# Patient Record
Sex: Male | Born: 1970 | Race: White | Hispanic: No | Marital: Married | Smoking: Current every day smoker
Health system: Southern US, Community
[De-identification: ages and names within clinical notes are randomized; demographics above are authoritative.]

## PROBLEM LIST (undated history)

## (undated) DIAGNOSIS — M1990 Unspecified osteoarthritis, unspecified site: Secondary | ICD-10-CM

## (undated) DIAGNOSIS — R51 Headache: Secondary | ICD-10-CM

## (undated) HISTORY — PX: KNEE SURGERY: SHX244

---

## 2005-02-10 ENCOUNTER — Encounter: Admission: RE | Admit: 2005-02-10 | Discharge: 2005-02-18 | Payer: Self-pay | Admitting: Family Medicine

## 2006-06-15 ENCOUNTER — Ambulatory Visit: Payer: Self-pay | Admitting: Hospitalist

## 2008-08-31 ENCOUNTER — Encounter: Payer: Self-pay | Admitting: Internal Medicine

## 2008-08-31 ENCOUNTER — Ambulatory Visit: Payer: Self-pay | Admitting: Internal Medicine

## 2008-08-31 DIAGNOSIS — M5126 Other intervertebral disc displacement, lumbar region: Secondary | ICD-10-CM

## 2008-10-04 ENCOUNTER — Ambulatory Visit: Payer: Self-pay | Admitting: Internal Medicine

## 2008-11-05 ENCOUNTER — Encounter: Payer: Self-pay | Admitting: Licensed Clinical Social Worker

## 2008-11-05 ENCOUNTER — Ambulatory Visit: Payer: Self-pay | Admitting: Internal Medicine

## 2008-11-08 ENCOUNTER — Encounter: Payer: Self-pay | Admitting: Licensed Clinical Social Worker

## 2008-11-17 ENCOUNTER — Encounter (INDEPENDENT_AMBULATORY_CARE_PROVIDER_SITE_OTHER): Payer: Self-pay | Admitting: *Deleted

## 2008-11-17 ENCOUNTER — Encounter (INDEPENDENT_AMBULATORY_CARE_PROVIDER_SITE_OTHER): Payer: Self-pay | Admitting: Internal Medicine

## 2008-11-18 ENCOUNTER — Encounter: Payer: Self-pay | Admitting: Internal Medicine

## 2008-11-19 ENCOUNTER — Telehealth: Payer: Self-pay | Admitting: *Deleted

## 2008-11-27 ENCOUNTER — Telehealth (INDEPENDENT_AMBULATORY_CARE_PROVIDER_SITE_OTHER): Payer: Self-pay | Admitting: *Deleted

## 2008-12-20 ENCOUNTER — Ambulatory Visit: Payer: Self-pay | Admitting: Internal Medicine

## 2008-12-20 ENCOUNTER — Encounter: Payer: Self-pay | Admitting: Internal Medicine

## 2008-12-27 ENCOUNTER — Telehealth: Payer: Self-pay | Admitting: *Deleted

## 2009-01-11 ENCOUNTER — Telehealth: Payer: Self-pay | Admitting: *Deleted

## 2009-01-16 ENCOUNTER — Encounter: Payer: Self-pay | Admitting: Internal Medicine

## 2009-03-04 ENCOUNTER — Encounter: Payer: Self-pay | Admitting: Internal Medicine

## 2010-12-28 ENCOUNTER — Encounter: Payer: Self-pay | Admitting: *Deleted

## 2012-01-19 DIAGNOSIS — G8929 Other chronic pain: Secondary | ICD-10-CM | POA: Diagnosis not present

## 2012-01-19 DIAGNOSIS — M545 Low back pain: Secondary | ICD-10-CM | POA: Diagnosis not present

## 2012-01-19 DIAGNOSIS — M5137 Other intervertebral disc degeneration, lumbosacral region: Secondary | ICD-10-CM | POA: Diagnosis not present

## 2012-02-16 DIAGNOSIS — M5137 Other intervertebral disc degeneration, lumbosacral region: Secondary | ICD-10-CM | POA: Diagnosis not present

## 2012-02-16 DIAGNOSIS — M545 Low back pain: Secondary | ICD-10-CM | POA: Diagnosis not present

## 2012-06-14 DIAGNOSIS — M545 Low back pain: Secondary | ICD-10-CM | POA: Diagnosis not present

## 2012-06-14 DIAGNOSIS — G8929 Other chronic pain: Secondary | ICD-10-CM | POA: Diagnosis not present

## 2012-08-23 DIAGNOSIS — M545 Low back pain: Secondary | ICD-10-CM | POA: Diagnosis not present

## 2012-08-23 DIAGNOSIS — G8929 Other chronic pain: Secondary | ICD-10-CM | POA: Diagnosis not present

## 2013-02-20 DIAGNOSIS — M545 Low back pain: Secondary | ICD-10-CM | POA: Diagnosis not present

## 2013-02-20 DIAGNOSIS — G8929 Other chronic pain: Secondary | ICD-10-CM | POA: Diagnosis not present

## 2013-02-20 DIAGNOSIS — K117 Disturbances of salivary secretion: Secondary | ICD-10-CM | POA: Diagnosis not present

## 2013-08-31 DIAGNOSIS — Z136 Encounter for screening for cardiovascular disorders: Secondary | ICD-10-CM | POA: Diagnosis not present

## 2013-08-31 DIAGNOSIS — D72829 Elevated white blood cell count, unspecified: Secondary | ICD-10-CM | POA: Diagnosis not present

## 2013-08-31 DIAGNOSIS — M545 Low back pain: Secondary | ICD-10-CM | POA: Diagnosis not present

## 2013-08-31 DIAGNOSIS — Z131 Encounter for screening for diabetes mellitus: Secondary | ICD-10-CM | POA: Diagnosis not present

## 2013-08-31 DIAGNOSIS — Z23 Encounter for immunization: Secondary | ICD-10-CM | POA: Diagnosis not present

## 2013-08-31 DIAGNOSIS — Z0000 Encounter for general adult medical examination without abnormal findings: Secondary | ICD-10-CM | POA: Diagnosis not present

## 2013-08-31 DIAGNOSIS — M5137 Other intervertebral disc degeneration, lumbosacral region: Secondary | ICD-10-CM | POA: Diagnosis not present

## 2013-08-31 DIAGNOSIS — Z791 Long term (current) use of non-steroidal anti-inflammatories (NSAID): Secondary | ICD-10-CM | POA: Diagnosis not present

## 2013-09-12 DIAGNOSIS — M549 Dorsalgia, unspecified: Secondary | ICD-10-CM | POA: Diagnosis not present

## 2013-09-26 ENCOUNTER — Other Ambulatory Visit: Payer: Self-pay | Admitting: Neurological Surgery

## 2013-09-26 DIAGNOSIS — M549 Dorsalgia, unspecified: Secondary | ICD-10-CM | POA: Diagnosis not present

## 2013-09-26 DIAGNOSIS — M47816 Spondylosis without myelopathy or radiculopathy, lumbar region: Secondary | ICD-10-CM

## 2013-09-26 DIAGNOSIS — M47817 Spondylosis without myelopathy or radiculopathy, lumbosacral region: Secondary | ICD-10-CM | POA: Diagnosis not present

## 2013-09-30 ENCOUNTER — Ambulatory Visit
Admission: RE | Admit: 2013-09-30 | Discharge: 2013-09-30 | Disposition: A | Discharge status: Home / Self Care | Payer: Medicare Other | Source: Ambulatory Visit | Attending: Neurological Surgery | Admitting: Neurological Surgery

## 2013-09-30 DIAGNOSIS — M5126 Other intervertebral disc displacement, lumbar region: Secondary | ICD-10-CM | POA: Diagnosis not present

## 2013-09-30 DIAGNOSIS — M47816 Spondylosis without myelopathy or radiculopathy, lumbar region: Secondary | ICD-10-CM

## 2013-10-10 DIAGNOSIS — Z13220 Encounter for screening for lipoid disorders: Secondary | ICD-10-CM | POA: Diagnosis not present

## 2013-10-10 DIAGNOSIS — Z0000 Encounter for general adult medical examination without abnormal findings: Secondary | ICD-10-CM | POA: Diagnosis not present

## 2013-10-10 DIAGNOSIS — Z131 Encounter for screening for diabetes mellitus: Secondary | ICD-10-CM | POA: Diagnosis not present

## 2013-10-10 DIAGNOSIS — Z791 Long term (current) use of non-steroidal anti-inflammatories (NSAID): Secondary | ICD-10-CM | POA: Diagnosis not present

## 2013-10-10 DIAGNOSIS — Z23 Encounter for immunization: Secondary | ICD-10-CM | POA: Diagnosis not present

## 2013-10-10 DIAGNOSIS — D72829 Elevated white blood cell count, unspecified: Secondary | ICD-10-CM | POA: Diagnosis not present

## 2013-10-10 DIAGNOSIS — M545 Low back pain: Secondary | ICD-10-CM | POA: Diagnosis not present

## 2013-10-10 DIAGNOSIS — M5137 Other intervertebral disc degeneration, lumbosacral region: Secondary | ICD-10-CM | POA: Diagnosis not present

## 2013-10-17 DIAGNOSIS — D72829 Elevated white blood cell count, unspecified: Secondary | ICD-10-CM | POA: Diagnosis not present

## 2013-10-17 DIAGNOSIS — Z13220 Encounter for screening for lipoid disorders: Secondary | ICD-10-CM | POA: Diagnosis not present

## 2013-10-17 DIAGNOSIS — Z131 Encounter for screening for diabetes mellitus: Secondary | ICD-10-CM | POA: Diagnosis not present

## 2013-10-17 DIAGNOSIS — Z0000 Encounter for general adult medical examination without abnormal findings: Secondary | ICD-10-CM | POA: Diagnosis not present

## 2013-10-17 DIAGNOSIS — M5137 Other intervertebral disc degeneration, lumbosacral region: Secondary | ICD-10-CM | POA: Diagnosis not present

## 2013-10-17 DIAGNOSIS — M549 Dorsalgia, unspecified: Secondary | ICD-10-CM | POA: Diagnosis not present

## 2013-10-17 DIAGNOSIS — M545 Low back pain: Secondary | ICD-10-CM | POA: Diagnosis not present

## 2013-10-17 DIAGNOSIS — Z791 Long term (current) use of non-steroidal anti-inflammatories (NSAID): Secondary | ICD-10-CM | POA: Diagnosis not present

## 2013-12-04 DIAGNOSIS — IMO0002 Reserved for concepts with insufficient information to code with codable children: Secondary | ICD-10-CM | POA: Diagnosis not present

## 2014-02-09 ENCOUNTER — Other Ambulatory Visit: Payer: Self-pay | Admitting: Neurological Surgery

## 2014-02-09 DIAGNOSIS — M549 Dorsalgia, unspecified: Secondary | ICD-10-CM | POA: Diagnosis not present

## 2014-02-09 DIAGNOSIS — E663 Overweight: Secondary | ICD-10-CM | POA: Diagnosis not present

## 2014-03-02 ENCOUNTER — Encounter (HOSPITAL_COMMUNITY): Payer: Self-pay | Admitting: Pharmacy Technician

## 2014-03-05 ENCOUNTER — Ambulatory Visit (HOSPITAL_COMMUNITY)
Admission: RE | Admit: 2014-03-05 | Discharge: 2014-03-05 | Disposition: A | Discharge status: Home / Self Care | Payer: Medicare Other | Source: Ambulatory Visit | Attending: Neurological Surgery | Admitting: Neurological Surgery

## 2014-03-05 ENCOUNTER — Encounter (HOSPITAL_COMMUNITY): Payer: Self-pay

## 2014-03-05 ENCOUNTER — Encounter (HOSPITAL_COMMUNITY)
Admission: RE | Admit: 2014-03-05 | Discharge: 2014-03-05 | Disposition: A | Discharge status: Home / Self Care | Payer: Medicare Other | Source: Ambulatory Visit | Attending: Neurological Surgery | Admitting: Neurological Surgery

## 2014-03-05 DIAGNOSIS — Z01810 Encounter for preprocedural cardiovascular examination: Secondary | ICD-10-CM | POA: Insufficient documentation

## 2014-03-05 DIAGNOSIS — Z01818 Encounter for other preprocedural examination: Secondary | ICD-10-CM | POA: Diagnosis not present

## 2014-03-05 DIAGNOSIS — Z01812 Encounter for preprocedural laboratory examination: Secondary | ICD-10-CM | POA: Insufficient documentation

## 2014-03-05 HISTORY — DX: Unspecified osteoarthritis, unspecified site: M19.90

## 2014-03-05 HISTORY — DX: Headache: R51

## 2014-03-05 LAB — BASIC METABOLIC PANEL
BUN: 14 mg/dL (ref 6–23)
CALCIUM: 9.2 mg/dL (ref 8.4–10.5)
CO2: 27 mEq/L (ref 19–32)
CREATININE: 1.07 mg/dL (ref 0.50–1.35)
Chloride: 104 mEq/L (ref 96–112)
GFR calc Af Amer: >90 mL/min (ref >90)
GFR calc non Af Amer: 84 mL/min — ABNORMAL LOW (ref >90)
Glucose, Bld: 105 mg/dL — ABNORMAL HIGH (ref 70–99)
Potassium: 4.5 mEq/L (ref 3.7–5.3)
Sodium: 143 mEq/L (ref 137–147)

## 2014-03-05 LAB — CBC WITH DIFFERENTIAL/PLATELET
BASOS ABS: 0.1 10*3/uL (ref 0.0–0.1)
Basophils Relative: 0 % (ref 0–1)
EOS ABS: 0.2 10*3/uL (ref 0.0–0.7)
EOS PCT: 1 % (ref 0–5)
HCT: 44.5 % (ref 39.0–52.0)
Hemoglobin: 15.5 g/dL (ref 13.0–17.0)
LYMPHS PCT: 34 % (ref 12–46)
Lymphs Abs: 5.3 10*3/uL — ABNORMAL HIGH (ref 0.7–4.0)
MCH: 32.1 pg (ref 26.0–34.0)
MCHC: 34.8 g/dL (ref 30.0–36.0)
MCV: 92.1 fL (ref 78.0–100.0)
Monocytes Absolute: 1.3 10*3/uL — ABNORMAL HIGH (ref 0.1–1.0)
Monocytes Relative: 9 % (ref 3–12)
NEUTROS PCT: 56 % (ref 43–77)
Neutro Abs: 8.5 10*3/uL — ABNORMAL HIGH (ref 1.7–7.7)
PLATELETS: 198 10*3/uL (ref 150–400)
RBC: 4.83 MIL/uL (ref 4.22–5.81)
RDW: 13.9 % (ref 11.5–15.5)
WBC: 15.3 10*3/uL — ABNORMAL HIGH (ref 4.0–10.5)

## 2014-03-05 LAB — TYPE AND SCREEN
ABO/RH(D): AB POS
ANTIBODY SCREEN: NEGATIVE

## 2014-03-05 LAB — PROTIME-INR
INR: 0.97 (ref 0.00–1.49)
PROTHROMBIN TIME: 12.7 s (ref 11.6–15.2)

## 2014-03-05 LAB — SURGICAL PCR SCREEN
MRSA, PCR: NEGATIVE
STAPHYLOCOCCUS AUREUS: NEGATIVE

## 2014-03-05 LAB — ABO/RH: ABO/RH(D): AB POS

## 2014-03-05 NOTE — Pre-Procedure Instructions (Signed)
Adonus Uselman  03/05/2014   Your procedure is scheduled on:  Thursday, April 9.  Report to [facility], Main Entrance/ntrance "A"at 5:30 AM.  Call this number if you have problems the morning of surgery: [phone]   Remember:   Do not eat food or drink liquids after midnight Wednesday.   Take these medicines the morning of surgery with A SIP OF WATER: -Pain med if needed for pain. Stop Meloxicam April 4 thprior to surgery.   Do not wear jewelry, make-up or nail polish.  Do not wear lotions, powders, or perfumes.   Do not shave 48 hours prior to surgery.  Do not bring valuables to the hospital.  [facility] is not responsible for any belongings or valuables.               Contacts, dentures or bridgework may not be worn into surgery.  Leave suitcase in the car. After surgery it may be brought to your room.  For patients admitted to the hospital, discharge time is determined by yourtreatment team.                Special Instructions:-[facility] - Preparing for Surgery  Before surgery, you can play an important role.  Because skin is not sterile, your skin needs to be as free of germs as possible.  You can reduce the number of germs on you skin by washing with CHG (chlorahexidine gluconate) soap before surgery.  CHG is an antiseptic cleaner which kills germs and bonds with the skin to continue killing germs even after washing.  Please DO NOT use if you have an allergy to CHG or antibacterial soaps.  If your skin becomes reddened/irritated stop using the CHG and inform your nurse when you arrive at Short Stay.  Do not shave (including legs and underarms) for at least 48 hours prior to the first CHG shower.  You may shave your face.  Please follow these instructions carefully:   1.  Shower with CHG Soap the night before surgery and the                                morning of Surgery.  2.  If you choose to wash your hair, wash your hair first as usual with your        normal shampoo.  3.  After you shampoo, rinse your hair and body thoroughly to remove the                      Shampoo.  4.  Use CHG as you would any other liquid soap.  You can apply chg directly       to the skin and wash gently with scrungie or a clean washcloth.  5.  Apply the CHG Soap to your body ONLY FROM THE NECK DOWN.        Do not use on open wounds or open sores.  Avoid contact with your eyes,       ears, mouth and genitals (private parts).  Wash genitals (private parts)       with your normal soap.  6.  Wash thoroughly, paying special attention to the area where your surgery        will be performed.  7.  Thoroughly rinse your body with warm water from the neck down.  8.  DO NOT shower/wash with your normal soap after using and  rinsing off       the CHG Soap.  9.  Pat yourself dry with a clean towel.            10.  Wear clean pajamas.            11.  Place clean sheets on your bed the night of your first shower and do not        sleep with pets.  Day of Surgery  Do not apply any lotions/deoderants the morning of surgery.  Please wear clean clothes to the hospital/surgery center.      Please read over the following fact sheets that you were given: Pain Booklet, Coughing and Deep Breathing, Blood Transfusion Information and Surgical Site Infection Prevention

## 2014-03-06 NOTE — Progress Notes (Signed)
Anesthesia Chart Review: patient is a 43 year old male scheduled for L4-5, L5-S1 MAS, PLIF on 03/15/14 by Dr. Yetta BarreJones. History includes smoking, headaches, arthritis.  Preoperative labs, EKG, CXR noted. WBC 15.3.  He was afebrile at PAT. Currently, no steroid listed on medication list. Will defer decision for any repeat labs to Dr. Yetta BarreJones. If no acute changes then I anticipate that he can proceed as planned from an anesthesia standpoint.  Velna Ochsllison Diem Dicocco, PA-C [facility]/Anesthesiology Phone [phone] 03/06/2014 2:20 PM

## 2014-03-14 MED ORDER — DEXAMETHASONE SODIUM PHOSPHATE 10 MG/ML IJ SOLN
10.0000 mg | INTRAMUSCULAR | Status: AC
  Administered 2014-03-15: 10 mg via INTRAVENOUS
  Filled 2014-03-14: qty 1

## 2014-03-14 MED ORDER — CEFAZOLIN SODIUM-DEXTROSE 2-3 GM-% IV SOLR
2.0000 g | INTRAVENOUS | Status: AC
  Administered 2014-03-15: 2 g via INTRAVENOUS
  Filled 2014-03-14: qty 50

## 2014-03-15 ENCOUNTER — Encounter (HOSPITAL_COMMUNITY): Payer: Medicare Other | Admitting: Vascular Surgery

## 2014-03-15 ENCOUNTER — Inpatient Hospital Stay (HOSPITAL_COMMUNITY): Payer: Medicare Other

## 2014-03-15 ENCOUNTER — Inpatient Hospital Stay (HOSPITAL_COMMUNITY)
Admission: RE | Admit: 2014-03-15 | Discharge: 2014-03-17 | DRG: 460 | Disposition: A | Discharge status: Home / Self Care | Payer: Medicare Other | Source: Ambulatory Visit | Attending: Neurological Surgery | Admitting: Neurological Surgery

## 2014-03-15 ENCOUNTER — Encounter (HOSPITAL_COMMUNITY)
Admission: RE | Disposition: A | Discharge status: Home / Self Care | Payer: Medicare Other | Source: Ambulatory Visit | Attending: Neurological Surgery

## 2014-03-15 ENCOUNTER — Encounter (HOSPITAL_COMMUNITY): Payer: Self-pay | Admitting: *Deleted

## 2014-03-15 ENCOUNTER — Inpatient Hospital Stay (HOSPITAL_COMMUNITY): Payer: Medicare Other | Admitting: Anesthesiology

## 2014-03-15 DIAGNOSIS — K219 Gastro-esophageal reflux disease without esophagitis: Secondary | ICD-10-CM | POA: Diagnosis present

## 2014-03-15 DIAGNOSIS — M519 Unspecified thoracic, thoracolumbar and lumbosacral intervertebral disc disorder: Secondary | ICD-10-CM | POA: Diagnosis not present

## 2014-03-15 DIAGNOSIS — M48061 Spinal stenosis, lumbar region without neurogenic claudication: Secondary | ICD-10-CM | POA: Diagnosis not present

## 2014-03-15 DIAGNOSIS — F17200 Nicotine dependence, unspecified, uncomplicated: Secondary | ICD-10-CM | POA: Diagnosis present

## 2014-03-15 DIAGNOSIS — M47817 Spondylosis without myelopathy or radiculopathy, lumbosacral region: Secondary | ICD-10-CM | POA: Diagnosis present

## 2014-03-15 DIAGNOSIS — M5137 Other intervertebral disc degeneration, lumbosacral region: Secondary | ICD-10-CM | POA: Diagnosis present

## 2014-03-15 DIAGNOSIS — M5126 Other intervertebral disc displacement, lumbar region: Principal | ICD-10-CM | POA: Diagnosis present

## 2014-03-15 DIAGNOSIS — Z981 Arthrodesis status: Secondary | ICD-10-CM

## 2014-03-15 DIAGNOSIS — M51379 Other intervertebral disc degeneration, lumbosacral region without mention of lumbar back pain or lower extremity pain: Secondary | ICD-10-CM | POA: Diagnosis present

## 2014-03-15 HISTORY — PX: MAXIMUM ACCESS (MAS)POSTERIOR LUMBAR INTERBODY FUSION (PLIF) 2 LEVEL: SHX6369

## 2014-03-15 SURGERY — FOR MAXIMUM ACCESS (MAS) POSTERIOR LUMBAR INTERBODY FUSION (PLIF) 2 LEVEL
Anesthesia: General | Site: Back

## 2014-03-15 MED ORDER — HYDROMORPHONE HCL PF 1 MG/ML IJ SOLN
INTRAMUSCULAR | Status: AC
  Filled 2014-03-15: qty 1

## 2014-03-15 MED ORDER — HYDROMORPHONE HCL PF 1 MG/ML IJ SOLN
0.2500 mg | INTRAMUSCULAR | Status: DC | PRN
  Administered 2014-03-15 (×4): 0.5 mg via INTRAVENOUS

## 2014-03-15 MED ORDER — SODIUM CHLORIDE 0.9 % IJ SOLN
3.0000 mL | Freq: Two times a day (BID) | INTRAMUSCULAR | Status: DC
  Administered 2014-03-15 – 2014-03-16 (×3): 3 mL via INTRAVENOUS

## 2014-03-15 MED ORDER — DIAZEPAM 5 MG/ML IJ SOLN
2.5000 mg | Freq: Once | INTRAMUSCULAR | Status: AC
  Administered 2014-03-15: 2.5 mg via INTRAVENOUS

## 2014-03-15 MED ORDER — PROMETHAZINE HCL 25 MG/ML IJ SOLN: 6.2500 mg | INTRAMUSCULAR | Status: DC | PRN

## 2014-03-15 MED ORDER — DEXAMETHASONE 4 MG PO TABS
4.0000 mg | ORAL_TABLET | Freq: Four times a day (QID) | ORAL | Status: DC
  Administered 2014-03-15 – 2014-03-16 (×7): 4 mg via ORAL
  Filled 2014-03-15 (×12): qty 1

## 2014-03-15 MED ORDER — FENTANYL CITRATE 0.05 MG/ML IJ SOLN
INTRAMUSCULAR | Status: DC | PRN
  Administered 2014-03-15: 100 ug via INTRAVENOUS
  Administered 2014-03-15 (×4): 50 ug via INTRAVENOUS
  Administered 2014-03-15: 100 ug via INTRAVENOUS
  Administered 2014-03-15 (×5): 50 ug via INTRAVENOUS

## 2014-03-15 MED ORDER — OXYCODONE HCL 5 MG PO TABS
5.0000 mg | ORAL_TABLET | Freq: Once | ORAL | Status: AC | PRN
  Administered 2014-03-15: 5 mg via ORAL

## 2014-03-15 MED ORDER — OXYCODONE-ACETAMINOPHEN 5-325 MG PO TABS
1.0000 | ORAL_TABLET | ORAL | Status: DC | PRN
  Administered 2014-03-15 – 2014-03-17 (×9): 2 via ORAL
  Filled 2014-03-15 (×9): qty 2

## 2014-03-15 MED ORDER — SUCCINYLCHOLINE CHLORIDE 20 MG/ML IJ SOLN
INTRAMUSCULAR | Status: DC | PRN
  Administered 2014-03-15: 100 mg via INTRAVENOUS

## 2014-03-15 MED ORDER — ACETAMINOPHEN 650 MG RE SUPP: 650.0000 mg | RECTAL | Status: DC | PRN

## 2014-03-15 MED ORDER — PROPOFOL 10 MG/ML IV BOLUS
INTRAVENOUS | Status: AC
  Filled 2014-03-15: qty 20

## 2014-03-15 MED ORDER — ACETAMINOPHEN 10 MG/ML IV SOLN
1000.0000 mg | Freq: Once | INTRAVENOUS | Status: AC
  Administered 2014-03-15: 1000 mg via INTRAVENOUS

## 2014-03-15 MED ORDER — DEXAMETHASONE SODIUM PHOSPHATE 4 MG/ML IJ SOLN
4.0000 mg | Freq: Four times a day (QID) | INTRAMUSCULAR | Status: DC
  Administered 2014-03-17: 4 mg via INTRAVENOUS
  Filled 2014-03-15 (×10): qty 1

## 2014-03-15 MED ORDER — PHENOL 1.4 % MT LIQD: 1.0000 | OROMUCOSAL | Status: DC | PRN

## 2014-03-15 MED ORDER — ONDANSETRON HCL 4 MG/2ML IJ SOLN
INTRAMUSCULAR | Status: DC | PRN
  Administered 2014-03-15: 4 mg via INTRAVENOUS

## 2014-03-15 MED ORDER — ONDANSETRON HCL 4 MG/2ML IJ SOLN
4.0000 mg | INTRAMUSCULAR | Status: DC | PRN
Start: 2014-03-15 — End: 2014-03-17
  Administered 2014-03-17: 4 mg via INTRAVENOUS
  Filled 2014-03-15: qty 2

## 2014-03-15 MED ORDER — CEFAZOLIN SODIUM 1-5 GM-% IV SOLN
1.0000 g | Freq: Three times a day (TID) | INTRAVENOUS | Status: AC
  Administered 2014-03-15 (×2): 1 g via INTRAVENOUS
  Filled 2014-03-15 (×2): qty 50

## 2014-03-15 MED ORDER — HYDROMORPHONE HCL PF 1 MG/ML IJ SOLN
INTRAMUSCULAR | Status: AC
  Administered 2014-03-15: 0.5 mg via INTRAVENOUS
  Filled 2014-03-15: qty 1

## 2014-03-15 MED ORDER — SODIUM CHLORIDE 0.9 % IJ SOLN: 3.0000 mL | INTRAMUSCULAR | Status: DC | PRN

## 2014-03-15 MED ORDER — SENNA 8.6 MG PO TABS
1.0000 | ORAL_TABLET | Freq: Two times a day (BID) | ORAL | Status: DC
  Administered 2014-03-15 – 2014-03-17 (×5): 8.6 mg via ORAL
  Filled 2014-03-15 (×6): qty 1

## 2014-03-15 MED ORDER — DEXTROSE 5 % IV SOLN
500.0000 mg | Freq: Four times a day (QID) | INTRAVENOUS | Status: DC | PRN
  Filled 2014-03-15: qty 5

## 2014-03-15 MED ORDER — THROMBIN 5000 UNITS EX SOLR
OROMUCOSAL | Status: DC | PRN
  Administered 2014-03-15: 09:00:00 via TOPICAL

## 2014-03-15 MED ORDER — SODIUM CHLORIDE 0.9 % IV SOLN: 250.0000 mL | INTRAVENOUS | Status: DC

## 2014-03-15 MED ORDER — MIDAZOLAM HCL 2 MG/2ML IJ SOLN: 0.5000 mg | Freq: Once | INTRAMUSCULAR | Status: DC | PRN

## 2014-03-15 MED ORDER — MIDAZOLAM HCL 2 MG/2ML IJ SOLN
INTRAMUSCULAR | Status: AC
  Filled 2014-03-15: qty 2

## 2014-03-15 MED ORDER — PROPOFOL 10 MG/ML IV BOLUS
INTRAVENOUS | Status: DC | PRN
  Administered 2014-03-15: 20 mg via INTRAVENOUS
  Administered 2014-03-15: 150 mg via INTRAVENOUS
  Administered 2014-03-15: 20 mg via INTRAVENOUS

## 2014-03-15 MED ORDER — CELECOXIB 200 MG PO CAPS
200.0000 mg | ORAL_CAPSULE | Freq: Two times a day (BID) | ORAL | Status: DC
  Administered 2014-03-15 – 2014-03-17 (×5): 200 mg via ORAL
  Filled 2014-03-15 (×6): qty 1

## 2014-03-15 MED ORDER — MORPHINE SULFATE 2 MG/ML IJ SOLN
1.0000 mg | INTRAMUSCULAR | Status: DC | PRN
  Administered 2014-03-15 – 2014-03-17 (×4): 4 mg via INTRAVENOUS
  Filled 2014-03-15 (×4): qty 2

## 2014-03-15 MED ORDER — LACTATED RINGERS IV SOLN
INTRAVENOUS | Status: DC | PRN
  Administered 2014-03-15 (×2): via INTRAVENOUS

## 2014-03-15 MED ORDER — NORTRIPTYLINE HCL 25 MG PO CAPS
50.0000 mg | ORAL_CAPSULE | Freq: Every day | ORAL | Status: DC
  Administered 2014-03-15 – 2014-03-16 (×2): 50 mg via ORAL
  Filled 2014-03-15 (×3): qty 2

## 2014-03-15 MED ORDER — ARTIFICIAL TEARS OP OINT
TOPICAL_OINTMENT | OPHTHALMIC | Status: AC
  Filled 2014-03-15: qty 3.5

## 2014-03-15 MED ORDER — POTASSIUM CHLORIDE IN NACL 20-0.9 MEQ/L-% IV SOLN
INTRAVENOUS | Status: DC
  Filled 2014-03-15 (×5): qty 1000

## 2014-03-15 MED ORDER — ROCURONIUM BROMIDE 50 MG/5ML IV SOLN
INTRAVENOUS | Status: AC
  Filled 2014-03-15: qty 1

## 2014-03-15 MED ORDER — ARTIFICIAL TEARS OP OINT
TOPICAL_OINTMENT | OPHTHALMIC | Status: DC | PRN
  Administered 2014-03-15: 1 via OPHTHALMIC

## 2014-03-15 MED ORDER — BUPIVACAINE HCL (PF) 0.25 % IJ SOLN
INTRAMUSCULAR | Status: DC | PRN
Start: 2014-03-15 — End: 2014-03-15
  Administered 2014-03-15: 9 mL

## 2014-03-15 MED ORDER — FENTANYL CITRATE 0.05 MG/ML IJ SOLN
INTRAMUSCULAR | Status: AC
  Filled 2014-03-15: qty 5

## 2014-03-15 MED ORDER — MEPERIDINE HCL 25 MG/ML IJ SOLN
6.2500 mg | INTRAMUSCULAR | Status: DC | PRN
Start: 2014-03-15 — End: 2014-03-15

## 2014-03-15 MED ORDER — OXYCODONE HCL 5 MG/5ML PO SOLN: 5.0000 mg | Freq: Once | ORAL | Status: AC | PRN

## 2014-03-15 MED ORDER — STERILE WATER FOR INJECTION IJ SOLN
INTRAMUSCULAR | Status: AC
  Filled 2014-03-15: qty 10

## 2014-03-15 MED ORDER — METHOCARBAMOL 500 MG PO TABS
500.0000 mg | ORAL_TABLET | Freq: Four times a day (QID) | ORAL | Status: DC | PRN
  Administered 2014-03-15 – 2014-03-17 (×6): 500 mg via ORAL
  Filled 2014-03-15 (×7): qty 1

## 2014-03-15 MED ORDER — ACETAMINOPHEN 10 MG/ML IV SOLN
INTRAVENOUS | Status: AC
  Administered 2014-03-15: 1000 mg via INTRAVENOUS
  Filled 2014-03-15: qty 100

## 2014-03-15 MED ORDER — MIDAZOLAM HCL 5 MG/5ML IJ SOLN
INTRAMUSCULAR | Status: DC | PRN
  Administered 2014-03-15 (×2): 1 mg via INTRAVENOUS

## 2014-03-15 MED ORDER — MENTHOL 3 MG MT LOZG: 1.0000 | LOZENGE | OROMUCOSAL | Status: DC | PRN

## 2014-03-15 MED ORDER — LIDOCAINE HCL (CARDIAC) 20 MG/ML IV SOLN
INTRAVENOUS | Status: DC | PRN
  Administered 2014-03-15: 30 mg via INTRAVENOUS

## 2014-03-15 MED ORDER — EPHEDRINE SULFATE 50 MG/ML IJ SOLN
INTRAMUSCULAR | Status: AC
  Filled 2014-03-15: qty 1

## 2014-03-15 MED ORDER — LIDOCAINE HCL (CARDIAC) 20 MG/ML IV SOLN
INTRAVENOUS | Status: AC
  Filled 2014-03-15: qty 5

## 2014-03-15 MED ORDER — SODIUM CHLORIDE 0.9 % IR SOLN
Status: DC | PRN
  Administered 2014-03-15: 09:00:00

## 2014-03-15 MED ORDER — ACETAMINOPHEN 325 MG PO TABS: 650.0000 mg | ORAL_TABLET | ORAL | Status: DC | PRN

## 2014-03-15 MED ORDER — DIAZEPAM 5 MG/ML IJ SOLN
INTRAMUSCULAR | Status: AC
  Filled 2014-03-15: qty 2

## 2014-03-15 MED ORDER — OXYCODONE HCL 5 MG PO TABS
ORAL_TABLET | ORAL | Status: AC
  Filled 2014-03-15: qty 1

## 2014-03-15 MED ORDER — THROMBIN 20000 UNITS EX SOLR
CUTANEOUS | Status: DC | PRN
  Administered 2014-03-15 (×2): via TOPICAL

## 2014-03-15 MED ORDER — 0.9 % SODIUM CHLORIDE (POUR BTL) OPTIME
TOPICAL | Status: DC | PRN
  Administered 2014-03-15: 1000 mL

## 2014-03-15 MED ORDER — ONDANSETRON HCL 4 MG/2ML IJ SOLN
INTRAMUSCULAR | Status: AC
  Filled 2014-03-15: qty 2

## 2014-03-15 MED ORDER — SUCCINYLCHOLINE CHLORIDE 20 MG/ML IJ SOLN
INTRAMUSCULAR | Status: AC
  Filled 2014-03-15: qty 1

## 2014-03-15 SURGICAL SUPPLY — 78 items
APL SKNCLS STERI-STRIP NONHPOA (GAUZE/BANDAGES/DRESSINGS) ×1
BAG DECANTER FOR FLEXI CONT (MISCELLANEOUS) ×3 IMPLANT
BENZOIN TINCTURE PRP APPL 2/3 (GAUZE/BANDAGES/DRESSINGS) ×3 IMPLANT
BLADE SURG ROTATE 9660 (MISCELLANEOUS) IMPLANT
BONE MATRIX OSTEOCEL PRO MED (Bone Implant) ×2 IMPLANT
BUR MATCHSTICK NEURO 3.0 LAGG (BURR) ×3 IMPLANT
CAGE COROENT 10X9X28-8 LUMBAR (Cage) ×4 IMPLANT
CAGE COROENT LRG 10X9X28-12 (Cage) ×4 IMPLANT
CANISTER SUCT 3000ML (MISCELLANEOUS) ×3 IMPLANT
CLIP NEUROVISION LG (CLIP) ×2 IMPLANT
CLOSURE WOUND 1/2 X4 (GAUZE/BANDAGES/DRESSINGS) ×1
CONT SPEC 4OZ CLIKSEAL STRL BL (MISCELLANEOUS) ×6 IMPLANT
COVER BACK TABLE 24X17X13 BIG (DRAPES) IMPLANT
COVER TABLE BACK 60X90 (DRAPES) ×3 IMPLANT
DRAPE C-ARM 42X72 X-RAY (DRAPES) ×3 IMPLANT
DRAPE C-ARMOR (DRAPES) ×3 IMPLANT
DRAPE LAPAROTOMY 100X72X124 (DRAPES) ×3 IMPLANT
DRAPE POUCH INSTRU U-SHP 10X18 (DRAPES) ×3 IMPLANT
DRAPE SURG 17X23 STRL (DRAPES) ×3 IMPLANT
DRESSING TELFA 8X3 (GAUZE/BANDAGES/DRESSINGS) ×5 IMPLANT
DRSG OPSITE 4X5.5 SM (GAUZE/BANDAGES/DRESSINGS) ×4 IMPLANT
DRSG OPSITE POSTOP 4X6 (GAUZE/BANDAGES/DRESSINGS) ×2 IMPLANT
DURAPREP 26ML APPLICATOR (WOUND CARE) ×3 IMPLANT
ELECT REM PT RETURN 9FT ADLT (ELECTROSURGICAL) ×3
ELECTRODE REM PT RTRN 9FT ADLT (ELECTROSURGICAL) ×1 IMPLANT
EVACUATOR 1/8 PVC DRAIN (DRAIN) ×3 IMPLANT
GAUZE SPONGE 4X4 16PLY XRAY LF (GAUZE/BANDAGES/DRESSINGS) IMPLANT
GLOVE BIO SURGEON STRL SZ8 (GLOVE) ×6 IMPLANT
GLOVE BIOGEL PI IND STRL 7.0 (GLOVE) IMPLANT
GLOVE BIOGEL PI IND STRL 7.5 (GLOVE) IMPLANT
GLOVE BIOGEL PI INDICATOR 7.0 (GLOVE) ×2
GLOVE BIOGEL PI INDICATOR 7.5 (GLOVE) ×10
GLOVE ECLIPSE 7.5 STRL STRAW (GLOVE) ×8 IMPLANT
GLOVE ECLIPSE 8.0 STRL XLNG CF (GLOVE) ×2 IMPLANT
GLOVE SS BIOGEL STRL SZ 6.5 (GLOVE) IMPLANT
GLOVE SUPERSENSE BIOGEL SZ 6.5 (GLOVE) ×6
GOWN BRE IMP SLV AUR LG STRL (GOWN DISPOSABLE) IMPLANT
GOWN BRE IMP SLV AUR XL STRL (GOWN DISPOSABLE) ×6 IMPLANT
GOWN STRL REIN 2XL LVL4 (GOWN DISPOSABLE) IMPLANT
GOWN STRL REUS W/ TWL LRG LVL3 (GOWN DISPOSABLE) IMPLANT
GOWN STRL REUS W/ TWL XL LVL3 (GOWN DISPOSABLE) IMPLANT
GOWN STRL REUS W/TWL 2XL LVL3 (GOWN DISPOSABLE) ×4 IMPLANT
GOWN STRL REUS W/TWL LRG LVL3 (GOWN DISPOSABLE) ×3
GOWN STRL REUS W/TWL XL LVL3 (GOWN DISPOSABLE) ×12
HEMOSTAT POWDER KIT SURGIFOAM (HEMOSTASIS) IMPLANT
HEMOSTAT POWDER SURGIFOAM 1G (HEMOSTASIS) ×2 IMPLANT
KIT BASIN OR (CUSTOM PROCEDURE TRAY) ×3 IMPLANT
KIT NDL NVM5 EMG ELECT (KITS) IMPLANT
KIT NEEDLE NVM5 EMG ELECT (KITS) ×1 IMPLANT
KIT NEEDLE NVM5 EMG ELECTRODE (KITS) ×2
KIT ROOM TURNOVER OR (KITS) ×3 IMPLANT
MILL MEDIUM DISP (BLADE) ×2 IMPLANT
NDL HYPO 25X1 1.5 SAFETY (NEEDLE) ×1 IMPLANT
NEEDLE HYPO 25X1 1.5 SAFETY (NEEDLE) ×3 IMPLANT
NS IRRIG 1000ML POUR BTL (IV SOLUTION) ×3 IMPLANT
PACK LAMINECTOMY NEURO (CUSTOM PROCEDURE TRAY) ×3 IMPLANT
PAD ARMBOARD 7.5X6 YLW CONV (MISCELLANEOUS) ×13 IMPLANT
ROD 70MM (Rod) ×3 IMPLANT
ROD FIXATION MAS PLIC 75MM (Rod) ×2 IMPLANT
ROD SPNL 70XPREBNT NS MAS (Rod) IMPLANT
SCREW LOCK (Screw) ×18 IMPLANT
SCREW LOCK FXNS SPNE MAS PL (Screw) IMPLANT
SCREW SHANK 5.0X35 (Screw) ×4 IMPLANT
SCREW SHANK 6.5X65 (Screw) ×4 IMPLANT
SCREW TULIP 5.5 (Screw) ×4 IMPLANT
SPONGE LAP 4X18 X RAY DECT (DISPOSABLE) IMPLANT
SPONGE SURGIFOAM ABS GEL 100 (HEMOSTASIS) ×3 IMPLANT
STRIP CLOSURE SKIN 1/2X4 (GAUZE/BANDAGES/DRESSINGS) ×3 IMPLANT
SUT VIC AB 0 CT1 18XCR BRD8 (SUTURE) ×1 IMPLANT
SUT VIC AB 0 CT1 8-18 (SUTURE) ×3
SUT VIC AB 2-0 CP2 18 (SUTURE) ×3 IMPLANT
SUT VIC AB 3-0 SH 8-18 (SUTURE) ×6 IMPLANT
SYR 20ML ECCENTRIC (SYRINGE) ×3 IMPLANT
SYR 3ML LL SCALE MARK (SYRINGE) IMPLANT
TOWEL OR 17X24 6PK STRL BLUE (TOWEL DISPOSABLE) ×3 IMPLANT
TOWEL OR 17X26 10 PK STRL BLUE (TOWEL DISPOSABLE) ×3 IMPLANT
TRAY FOLEY CATH 14FRSI W/METER (CATHETERS) ×1 IMPLANT
WATER STERILE IRR 1000ML POUR (IV SOLUTION) ×3 IMPLANT

## 2014-03-15 NOTE — Op Note (Signed)
03/15/2014  11:45 AM  PATIENT:  Keith Dawson  43 y.o. male  PRE-OPERATIVE DIAGNOSIS:  1. Degenerative disc disease with disc protrusion L4-5 L5-S1, 2. Lumbar spondylosis L4-5 L5-S1, 3. Back and leg pain  POST-OPERATIVE DIAGNOSIS:  Same  PROCEDURE:   1. Decompressive lumbar laminectomy L4-5 L5-S1 requiring more work than would be required for a simple exposure of the disk for PLIF in order to adequately decompress the neural elements and address the spinal stenosis 2. Posterior lumbar interbody fusion L4-5 L5-S1 using PEEK interbody cages packed with morcellized allograft and autograft 3. Posterior fixation L4-S1 inclusive using cortical pedicle screws.    SURGEON:  Marikay Alaravid Sarissa Dern, MD  ASSISTANTS: Dr. Gerlene FeeKritzer  ANESTHESIA:  General  EBL: 300 ml  Total I/O In: 1600 [I.V.:1600] Out: 850 [Urine:550; Blood:300]  BLOOD ADMINISTERED:none  DRAINS: Hemovac   INDICATION FOR PROCEDURE: This patient presents with back and leg pain for quite a long time. He tried medical management and injection therapy without relief. MRI showed multilevel degenerative disc disease with disc extrusions. Recommended decompression and fusion. Patient understood the risks, benefits, and alternatives and potential outcomes and wished to proceed.  PROCEDURE DETAILS:  The patient was brought to the operating room. After induction of generalized endotracheal anesthesia the patient was rolled into the prone position on chest rolls and all pressure points were padded. The patient's lumbar region was cleaned and then prepped with DuraPrep and draped in the usual sterile fashion. Anesthesia was injected and then a dorsal midline incision was made and carried down to the lumbosacral fascia. The fascia was opened and the paraspinous musculature was taken down in a subperiosteal fashion to expose L4-5 and L5-S1. A self-retaining retractor was placed. Intraoperative fluoroscopy confirmed my level, and I started with placement of  the L4 cortical pedicle screws. The pedicle screw entry zones were identified utilizing surface landmarks and  AP and lateral fluoroscopy. I scored the cortex with the high-speed drill and then used the hand drill and EMG monitoring to drill an upward and outward direction into the pedicle. I then tapped line to line, and the tap was also monitored. I then placed a 5-0 x 35 mm cortical pedicle screw into the pedicles of L4 bilaterally. I then turned my attention to the decompression and the spinous process was removed and complete lumbar laminectomies, hemi- facetectomies, and foraminotomies were performed at L4-5 and L5-S1. The patient had significant spinal stenosis and this required more work than would be required for a simple exposure of the disc for posterior lumbar interbody fusion. Much more generous decompression was undertaken in order to adequately decompress the neural elements and address the patient's leg pain. The yellow ligament was removed to expose the underlying dura and nerve roots, and generous foraminotomies were performed to adequately decompress the neural elements. Both the exiting and traversing nerve roots were decompressed on both sides until a coronary dilator passed easily along the nerve roots at L4-5 and L5-S1. Once the decompression was complete, I turned my attention to the posterior lower lumbar interbody fusion. The epidural venous vasculature was coagulated and cut sharply. Disc space was incised and the initial discectomy was performed with pituitary rongeurs. The disc space was distracted with sequential distractors to a height of 10 mm at each level. We then used a series of scrapers and shavers to prepare the endplates for fusion. The midline was prepared with Epstein curettes. Once the complete discectomy was finished, we packed an appropriate sized peek interbody cage with local autograft  and morcellized allograft, gently retracted the nerve root, and tapped the cage into  position at L4-5 and L5-S1.  The midline between the cages was packed with morselized autograft and allograft. We then turned our attention to the placement of the lower pedicle screws. The pedicle screw entry zones were identified utilizing surface landmarks and fluoroscopy. I drilled into each pedicle utilizing the hand drill and EMG monitoring, and tapped each pedicle with the appropriate tap. We palpated with a ball probe to assure no break in the cortex. We then placed 5-0 x 35 mm cortical pedicle screws into the pedicles bilaterally at L5. I placed 6 5 x 35 mm screws into the sacrum bilaterally. We then placed lordotic rods into the multiaxial screw heads of the pedicle screws and locked these in position with the locking caps and anti-torque device. We then checked our construct with AP and lateral fluoroscopy. Irrigated with copious amounts of bacitracin-containing saline solution. Placed a medium Hemovac drain through separate stab incision. Inspected the nerve roots once again to assure adequate decompression, lined to the dura with Gelfoam, and closed the muscle and the fascia with 0 Vicryl. Closed the subcutaneous tissues with 2-0 Vicryl and subcuticular tissues with 3-0 Vicryl. The skin was closed with benzoin and Steri-Strips. Dressing was then applied, the patient was awakened from general anesthesia and transported to the recovery room in stable condition. At the end of the procedure all sponge, needle and instrument counts were correct.   PLAN OF CARE: Admit to inpatient   PATIENT DISPOSITION:  PACU - hemodynamically stable.   Delay start of Pharmacological VTE agent (>24hrs) due to surgical blood loss or risk of bleeding:  yes

## 2014-03-15 NOTE — H&P (Signed)
Subjective: Patient is a 43 y.o. male admitted for back and leg pain. Onset of symptoms was several months ago, gradually worsening since that time.  The pain is rated severe, and is located at the across the lower back and radiates to legs. The pain is described as aching and occurs all day. The symptoms have been progressive. Symptoms are exacerbated by exercise. MRI or CT showed DDD/ spondylosis L4-5, L5-S1   Past Medical History  Diagnosis Date  . Headache(784.0)   . Arthritis     back    Past Surgical History  Procedure Laterality Date  . Knee surgery Left     Prior to Admission medications   Medication Sig Start Date End Date Taking? Authorizing Provider  meloxicam (MOBIC) 7.5 MG tablet Take 7.5 mg by mouth 2 (two) times daily.   Yes Historical Provider, MD  morphine (MS CONTIN) 15 MG 12 hr tablet Take 15 mg by mouth every 12 (twelve) hours as needed for pain.   Yes Historical Provider, MD  nortriptyline (PAMELOR) 50 MG capsule Take 50 mg by mouth at bedtime.   Yes Historical Provider, MD   Allergies  Allergen Reactions  . Codeine Itching and Nausea And Vomiting    History  Substance Use Topics  . Smoking status: Current Every Day Smoker -- 1.00 packs/day for 26 years  . Smokeless tobacco: Never Used  . Alcohol Use: Yes     Comment: social    History reviewed. No pertinent family history.   Review of Systems  Positive ROS: neg  All other systems have been reviewed and were otherwise negative with the exception of those mentioned in the HPI and as above.  Objective: Vital signs in last 24 hours: Temp:  [97.6 F (36.4 C)] 97.6 F (36.4 C) (04/09 0542) Pulse Rate:  [83] 83 (04/09 0542) Resp:  [20] 20 (04/09 0542) BP: (131)/(88) 131/88 mmHg (04/09 0542) SpO2:  [96 %] 96 % (04/09 0542) Weight:  [97.07 kg (214 lb)] 97.07 kg (214 lb) (04/09 0542)  General Appearance: Alert, cooperative, no distress, appears stated age Head: Normocephalic, without obvious  abnormality, atraumatic Eyes: PERRL, conjunctiva/corneas clear, EOM's intact    Neck: Supple, symmetrical, trachea midline Back: Symmetric, no curvature, ROM normal, no CVA tenderness Lungs:  respirations unlabored Heart: Regular rate and rhythm Abdomen: Soft, non-tender Extremities: Extremities normal, atraumatic, no cyanosis or edema Pulses: 2+ and symmetric all extremities Skin: Skin color, texture, turgor normal, no rashes or lesions  NEUROLOGIC:   Mental status: Alert and oriented x4,  no aphasia, good attention span, fund of knowledge, and memory Motor Exam - grossly normal Sensory Exam - grossly normal Reflexes: 1+ Coordination - grossly normal Gait - grossly normal Balance - grossly normal Cranial Nerves: I: smell Not tested  II: visual acuity  OS: nl    OD: nl  II: visual fields Full to confrontation  II: pupils Equal, round, reactive to light  III,VII: ptosis None  III,IV,VI: extraocular muscles  Full ROM  V: mastication Normal  V: facial light touch sensation  Normal  V,VII: corneal reflex  Present  VII: facial muscle function - upper  Normal  VII: facial muscle function - lower Normal  VIII: hearing Not tested  IX: soft palate elevation  Normal  IX,X: gag reflex Present  XI: trapezius strength  5/5  XI: sternocleidomastoid strength 5/5  XI: neck flexion strength  5/5  XII: tongue strength  Normal    Data Review Lab Results  Component Value Date  WBC 15.3* 03/05/2014   HGB 15.5 03/05/2014   HCT 44.5 03/05/2014   MCV 92.1 03/05/2014   PLT 198 03/05/2014   Lab Results  Component Value Date   NA 143 03/05/2014   K 4.5 03/05/2014   CL 104 03/05/2014   CO2 27 03/05/2014   BUN 14 03/05/2014   CREATININE 1.07 03/05/2014   GLUCOSE 105* 03/05/2014   Lab Results  Component Value Date   INR 0.97 03/05/2014    Assessment/Plan: Patient admitted for PLIF. Patient has failed a reasonable attempt at conservative therapy.  I explained the condition and procedure to  the patient and answered any questions.  Patient wishes to proceed with procedure as planned. Understands risks/ benefits and typical outcomes of procedure.   Tia AlertDavid S Taevon Aschoff 03/15/2014 7:17 AM

## 2014-03-15 NOTE — Anesthesia Preprocedure Evaluation (Addendum)
Anesthesia Evaluation  Patient identified by MRN, date of birth, ID band Patient awake    Reviewed: Allergy & Precautions, H&P , NPO status , Patient's Chart, lab work & pertinent test results  History of Anesthesia Complications Negative for: history of anesthetic complications  Airway Mallampati: II TM Distance: >3 FB Neck ROM: Full    Dental  (+) Teeth Intact, Dental Advisory Given   Pulmonary Current Smoker,  breath sounds clear to auscultation  Pulmonary exam normal       Cardiovascular negative cardio ROS  Rhythm:Regular Rate:Normal     Neuro/Psych Chronic back pain: narcotics    GI/Hepatic Neg liver ROS, GERD-  Medicated and Controlled,  Endo/Other  negative endocrine ROS  Renal/GU negative Renal ROS     Musculoskeletal   Abdominal   Peds  Hematology negative hematology ROS (+)   Anesthesia Other Findings   Reproductive/Obstetrics                         Anesthesia Physical Anesthesia Plan  ASA: II  Anesthesia Plan: General   Post-op Pain Management:    Induction: Intravenous  Airway Management Planned: Oral ETT  Additional Equipment:   Intra-op Plan:   Post-operative Plan: Extubation in OR  Informed Consent: I have reviewed the patients History and Physical, chart, labs and discussed the procedure including the risks, benefits and alternatives for the proposed anesthesia with the patient or authorized representative who has indicated his/her understanding and acceptance.   Dental advisory given  Plan Discussed with: Anesthesiologist, Surgeon and CRNA  Anesthesia Plan Comments: (Plan routine monitors, GETA)       Anesthesia Quick Evaluation

## 2014-03-15 NOTE — Anesthesia Postprocedure Evaluation (Signed)
  Anesthesia Post-op Note  Patient: Keith Dawson  Procedure(s) Performed: Procedure(s): FOR MAXIMUM ACCESS (MAS) POSTERIOR LUMBAR INTERBODY FUSION (PLIF) 2 LEVEL four/five five/sacral one (N/A)  Patient Location: PACU  Anesthesia Type:General  Level of Consciousness: awake, alert , oriented and patient cooperative  Airway and Oxygen Therapy: Patient Spontanous Breathing and Patient connected to nasal cannula oxygen  Post-op Pain: mild  Post-op Assessment: Post-op Vital signs reviewed and Patient's Cardiovascular Status Stable  Post-op Vital Signs: Reviewed and stable  Last Vitals:  Filed Vitals:   03/15/14 1315  BP: 105/76  Pulse: 104  Temp: 36.7 C  Resp: 14    Complications: No apparent anesthesia complications

## 2014-03-15 NOTE — Progress Notes (Signed)
Utilization review completed.  

## 2014-03-15 NOTE — Transfer of Care (Signed)
Immediate Anesthesia Transfer of Care Note  Patient: Keith Dawson  Procedure(s) Performed: Procedure(s): FOR MAXIMUM ACCESS (MAS) POSTERIOR LUMBAR INTERBODY FUSION (PLIF) 2 LEVEL four/five five/sacral one (N/A)  Patient Location: PACU  Anesthesia Type:General  Level of Consciousness: awake, alert  and oriented  Airway & Oxygen Therapy: Patient Spontanous Breathing and Patient connected to nasal cannula oxygen  Post-op Assessment: Report given to PACU RN, Post -op Vital signs reviewed and stable and Patient moving all extremities X 4  Post vital signs: Reviewed and stable  Complications: No apparent anesthesia complications

## 2014-03-15 NOTE — Anesthesia Procedure Notes (Signed)
Procedure Name: Intubation Date/Time: 03/15/2014 7:35 AM Performed by: Gayla MedicusHYPES, Kemoni Ortega M. Pre-anesthesia Checklist: Patient identified, Patient being monitored, Emergency Drugs available, Timeout performed and Suction available Patient Re-evaluated:Patient Re-evaluated prior to inductionOxygen Delivery Method: Circle system utilized Preoxygenation: Pre-oxygenation with 100% oxygen Intubation Type: IV induction Ventilation: Mask ventilation without difficulty Laryngoscope Size: Mac and 3 Grade View: Grade I Tube type: Oral Tube size: 7.5 mm Number of attempts: 1 Airway Equipment and Method: Stylet Placement Confirmation: ETT inserted through vocal cords under direct vision,  positive ETCO2 and breath sounds checked- equal and bilateral Secured at: 22 cm Tube secured with: Tape Dental Injury: Teeth and Oropharynx as per pre-operative assessment

## 2014-03-16 MED FILL — Sodium Chloride IV Soln 0.9%: INTRAVENOUS | Qty: 1000 | Status: AC

## 2014-03-16 MED FILL — Heparin Sodium (Porcine) Inj 1000 Unit/ML: INTRAMUSCULAR | Qty: 30 | Status: AC

## 2014-03-16 NOTE — Progress Notes (Signed)
Patient ID: Keith Dawson, male   DOB: 04/17/1971, 43 y.o.   MRN: [id number] Subjective: Patient reports significant back soreness, leg numbness but no leg weakness, some numbness R hand  Objective: Vital signs in last 24 hours: Temp:  [97.7 F (36.5 C)-98.7 F (37.1 C)] 97.9 F (36.6 C) (04/10 0400) Pulse Rate:  [86-107] 86 (04/10 0400) Resp:  [10-20] 18 (04/10 0400) BP: (90-113)/(54-76) 103/60 mmHg (04/10 0400) SpO2:  [93 %-98 %] 95 % (04/10 0400)  Intake/Output from previous day: 04/09 0701 - 04/10 0700 In: 1600 [I.V.:1600] Out: 1520 [Urine:950; Drains:270; Blood:300] Intake/Output this shift:    Neurologic: Grossly normal, good strength in legs and hands, walking ok  Lab Results: Lab Results  Component Value Date   WBC 15.3* 03/05/2014   HGB 15.5 03/05/2014   HCT 44.5 03/05/2014   MCV 92.1 03/05/2014   PLT 198 03/05/2014   Lab Results  Component Value Date   INR 0.97 03/05/2014   BMET Lab Results  Component Value Date   NA 143 03/05/2014   K 4.5 03/05/2014   CL 104 03/05/2014   CO2 27 03/05/2014   GLUCOSE 105* 03/05/2014   BUN 14 03/05/2014   CREATININE 1.07 03/05/2014   CALCIUM 9.2 03/05/2014    Studies/Results: Dg Lumbar Spine 2-3 Views  03/15/2014   CLINICAL DATA:  Lumbar spine fusion  EXAM: DG C-ARM 61-120 MIN; LUMBAR SPINE - 2-3 VIEW  : COMPARISON:  09/26/2013  FINDINGS: Pedicle screws and interconnecting rods diffuse L4 through S1. The orthopedic hardware is well-seated and aligned. Radiolucent disc spacers maintaining disc height at these levels. These are well positioned. No evidence of an operative complication.  IMPRESSION: Lumbar spine fusion from L4 through S1 as described. Please refer to the procedure report for a complete description.   Electronically Signed   By: Amie Portlandavid  Ormond M.D.   On: 03/15/2014 12:58   Dg C-arm 61-120 Min  03/15/2014   CLINICAL DATA:  Lumbar spine fusion  EXAM: DG C-ARM 61-120 MIN; LUMBAR SPINE - 2-3 VIEW  : COMPARISON:  09/26/2013   FINDINGS: Pedicle screws and interconnecting rods diffuse L4 through S1. The orthopedic hardware is well-seated and aligned. Radiolucent disc spacers maintaining disc height at these levels. These are well positioned. No evidence of an operative complication.  IMPRESSION: Lumbar spine fusion from L4 through S1 as described. Please refer to the procedure report for a complete description.   Electronically Signed   By: Amie Portlandavid  Ormond M.D.   On: 03/15/2014 12:58    Assessment/Plan: Doing as expected. Pain control, PT/OT, mobilize today   LOS: 1 day    Tia AlertDavid S Dorcas Melito 03/16/2014, 7:37 AM

## 2014-03-16 NOTE — Evaluation (Addendum)
Occupational Therapy Evaluation Patient Details Name: Keith Dawson MRN: [id number] DOB: 10-08-1971 Today's Date: 03/16/2014    History of Present Illness Pt is a 43 year old who is s/p POSTERIOR LUMBAR INTERBODY FUSION (PLIF) 2 LEVEL four/five five/sacral one. He was admitted for back and leg pain. He has history of L knee surgery.    Clinical Impression   Pt overall moving at min guard level for functional mobility in room and currently needing assist with LB ADL without AE. He is considering purchasing AE versus wife assist. Pt reports numbness 1st through 3rd digits and fingertip of 4th digit. Surgeon aware. All education completed with pt and spouse. No further acute OT needs.    Follow Up Recommendations  No OT follow up    Equipment Recommendations  None recommended by OT    Recommendations for Other Services       Precautions / Restrictions Precautions Precautions: Back Precaution Booklet Issued: Yes (comment) Precaution Comments: Issued back care handout and reviewed all precautions with pt Required Braces or Orthoses: Spinal Brace Spinal Brace: Lumbar corset;Applied in sitting position      Mobility Bed Mobility               General bed mobility comments: pt supine when OT initially arrived and when OT returned he had sat up on EOB. reviewed log roll technique to make sure he understands back precautions with OOB.  Transfers Overall transfer level: Needs assistance Equipment used: None Transfers: Sit to/from Stand Sit to Stand: Supervision         General transfer comment: min verbal cues for back precautions.    Balance                                            ADL Overall ADL's : Needs assistance/impaired Eating/Feeding: Independent;Sitting   Grooming: Wash/dry hands;Supervision/safety;Standing   Upper Body Bathing: Set up;Sitting   Lower Body Bathing: Minimal assistance Lower Body Bathing Details (indicate cue type and  reason): for lower legs and feet without AE Upper Body Dressing : Supervision/safety Upper Body Dressing Details (indicate cue type and reason): to don own brace; reviewed technique for donning shirt. Lower Body Dressing: Moderate assistance;Sit to/from stand Lower Body Dressing Details (indicate cue type and reason): without AE for starting clothing over LEs. Pt unable to cross LEs up. min verbal cues for back precautions. Toilet Transfer: Min guard;Ambulation;Comfort height toilet;Grab bars   Toileting- Clothing Manipulation and Hygiene: Supervision/safety;Sit to/from stand   Tub/ Shower Transfer: Walk-in shower;Min Psychologist, prison and probation services Details (indicate cue type and reason): side step over simulated ledge   General ADL Comments: Pt states he has some periods of being up when his L leg at hip feels like it "quivers" and he has to stop and provide some tactile input to his hip until he can start moving again. Informed PT. May benefit from a cane; will defer to PT. Discussed use of a 3in1 for extra UE support although he does have a counter on one side at home. Pt and wife unsure of exact height of commode but think it is similar to one here. They think they can borrow a 3in1 from a family member but educated on letting nursing know if they decide they would like a 3in1. Feel it could help for the first week or so but also feel he would be fine with his  toilet and counter as long as it is not lower than the one here. Wife and pt aware of all and will look at their toilet height also. Educated on AE options and coverage. Pt didnt lose balance while up with OT but did walk slowly and cautiously. Reviewed all precautions. Pt states surgeon feels the numbness in R digits is ?? due to positioning during surgery     Vision                     Perception     Praxis      Pertinent Vitals/Pain 3-4/10 with activity; nursing informed; reposition.     Hand Dominance     Extremity/Trunk  Assessment Upper Extremity Assessment Upper Extremity Assessment: RUE deficits/detail RUE Deficits / Details: ROM WFL; pt reports sensation deficits and does display decreased grip R hand compared to L RUE Sensation: decreased light touch (pt states first through 3rd digits are numb. fingertip of 4th digit is a little numb per his report)           Communication Communication Communication: No difficulties   Cognition Arousal/Alertness: Awake/alert Behavior During Therapy: WFL for tasks assessed/performed Overall Cognitive Status: Within Functional Limits for tasks assessed                     General Comments       Exercises       Shoulder Instructions      Home Living Family/patient expects to be discharged to:: Private residence Living Arrangements: Spouse/significant other Available Help at Discharge: Family Type of Home: House Home Access: Stairs to enter Entergy CorporationEntrance Stairs-Number of Steps: 5 Entrance Stairs-Rails: Right;Left Home Layout: Two level;Able to live on main level with bedroom/bathroom     Bathroom Shower/Tub: Producer, television/film/videoWalk-in shower   Bathroom Toilet: Handicapped height     Home Equipment: Bedside commode;Grab bars - tub/shower   Additional Comments: pt's wife reports she can likely borrow a 3in1. He has a walking stick at home.       Prior Functioning/Environment Level of Independence: Independent             OT Diagnosis:     OT Problem List:     OT Treatment/Interventions:      OT Goals(Current goals can be found in the care plan section) Acute Rehab OT Goals Patient Stated Goal: to increase independence  OT Frequency:     Barriers to D/C:            Co-evaluation              End of Session    Activity Tolerance: Patient tolerated treatment well Patient left: in bed (at EOB with wife)   Time: [id number] OT Time Calculation (min): 40 min Charges:  OT General Charges $OT Visit: 1 Procedure OT Evaluation $Initial OT  Evaluation Tier I: 1 Procedure OT Treatments $Self Care/Home Management : 8-22 mins $Therapeutic Activity: 8-22 mins G-Codes:    Lennox LaityStephanie Stafford Rand Boller [phone] 03/16/2014, 12:46 PM

## 2014-03-16 NOTE — Evaluation (Addendum)
Physical Therapy Evaluation and Discharge Patient Details Name: Keith Dawson MRN: [id number] DOB: 07/05/1971 Today's Date: 03/16/2014   History of Present Illness  Pt is a 43 year old who is s/p POSTERIOR LUMBAR INTERBODY FUSION (PLIF) 2 LEVEL four/five five/sacral one. He was admitted for back and leg pain. He has history of L knee surgery.   Clinical Impression  This patient presents with acute pain and decreased functional independence following the above mentioned procedure. At the time of PT eval, pt was able to ambulate 200 feet without an AD for support. States some numbness in LLE and holds hip while walking to feel for muscle tremors - pt indicates that the tremors are a sign his leg is about to give out on him. This patient is functioning at a supervision/mod I level for all mobility, and will have wife present at home for assist. This patient has no skilled PT needs at this time, and PT is signing off. If needs change, please reconsult.     Follow Up Recommendations Outpatient PT;Supervision - Intermittent (When appropriate per post-op protocol.)    Equipment Recommendations  Cane (Offered to pt but pt declined. )    Recommendations for Other Services       Precautions / Restrictions Precautions Precautions: Fall;Back Precaution Booklet Issued: Yes (comment) Precaution Comments: OT issued. PT reviewed with pt. Required Braces or Orthoses: Spinal Brace Spinal Brace: Lumbar corset;Applied in sitting position Restrictions Weight Bearing Restrictions: No      Mobility  Bed Mobility Overal bed mobility: Modified Independent             General bed mobility comments: Use of bed rails to transition to full sitting on EOB. No physical assist required, and pt maintained back precautions well.   Transfers Overall transfer level: Needs assistance Equipment used: None Transfers: Sit to/from Stand Sit to Stand: Modified independent (Device/Increase time)          General transfer comment: Maintained back precautions well. Increased time needed to achieve full standing.   Ambulation/Gait Ambulation/Gait assistance: Supervision Ambulation Distance (Feet): 200 Feet Assistive device: None Gait Pattern/deviations: Step-through pattern;Decreased stride length Gait velocity: Decreased Gait velocity interpretation: Below normal speed for age/gender General Gait Details: Pt holding L hip to feel for tactile cues that his leg may give out on him (pt states muscles start trembling). Occasionally uses hand rail for support. Offers cane for home but pt declines, stating he has a walking stick he can use if he needs it. VC's for improved posture and safety awareness.  Stairs            Wheelchair Mobility    Modified Rankin (Stroke Patients Only)       Balance Overall balance assessment: No apparent balance deficits (not formally assessed)                                           Pertinent Vitals/Pain Pt reports minimal pain throughout session. States he will wait for the pain medication.     Home Living Family/patient expects to be discharged to:: Private residence Living Arrangements: Spouse/significant other Available Help at Discharge: Family;Available 24 hours/day Type of Home: House Home Access: Stairs to enter Entrance Stairs-Rails: Doctor, general practiceight;Left Entrance Stairs-Number of Steps: 5 Home Layout: Two level;Able to live on main level with bedroom/bathroom Home Equipment: Bedside commode;Grab bars - tub/shower Additional Comments: pt's wife reports she can  likely borrow a 3in1. He has a walking stick at home.     Prior Function Level of Independence: Independent               Hand Dominance   Dominant Hand: Right    Extremity/Trunk Assessment   Upper Extremity Assessment: Defer to OT evaluation RUE Deficits / Details: ROM WFL; pt reports sensation deficits and does display decreased grip R hand compared to  L   RUE Sensation: decreased light touch (pt states first through 3rd digits are numb. fingertip of 4th digit is a little numb per his report)     Lower Extremity Assessment: LLE deficits/detail   LLE Deficits / Details: Pt holding L hip during gait training, stating he can tell whether the leg is going to give out on him because the hip muscles start trembling.   Cervical / Trunk Assessment: Normal  Communication   Communication: No difficulties  Cognition Arousal/Alertness: Awake/alert Behavior During Therapy: WFL for tasks assessed/performed Overall Cognitive Status: Within Functional Limits for tasks assessed                      General Comments      Exercises        Assessment/Plan    PT Assessment Patent does not need any further PT services  PT Diagnosis     PT Problem List    PT Treatment Interventions     PT Goals (Current goals can be found in the Care Plan section) Acute Rehab PT Goals Patient Stated Goal: to increase independence PT Goal Formulation: With patient/family Time For Goal Achievement: 03/23/14 Potential to Achieve Goals: Good    Frequency     Barriers to discharge        Co-evaluation               End of Session Equipment Utilized During Treatment: Back brace Activity Tolerance: Patient tolerated treatment well Patient left: Other (comment) (Sitting EOB with wife present in room) Nurse Communication: Mobility status         Time: 6578-4696 PT Time Calculation (min): 23 min   Charges:   PT Evaluation $Initial PT Evaluation Tier I: 1 Procedure PT Treatments $Gait Training: 8-22 mins   PT G CodesRuthann Cancer 03/16/2014, 1:49 PM  Ruthann Cancer, PT, DPT Acute Rehabilitation Services Pager: [phone]

## 2014-03-17 MED ORDER — OXYCODONE-ACETAMINOPHEN 5-325 MG PO TABS: 1.0000 | ORAL_TABLET | ORAL | Status: DC | PRN

## 2014-03-17 MED ORDER — MORPHINE SULFATE ER 15 MG PO TBCR: 15.0000 mg | EXTENDED_RELEASE_TABLET | Freq: Two times a day (BID) | ORAL | Status: DC | PRN

## 2014-03-17 MED ORDER — DIAZEPAM 5 MG PO TABS: 5.0000 mg | ORAL_TABLET | Freq: Four times a day (QID) | ORAL | Status: DC | PRN

## 2014-03-17 NOTE — Discharge Instructions (Signed)
Wound Care Keep incision covered  You may remove outer bandage and shower.  Do not put any creams, lotions, or ointments on incision. Leave steri-strips on back.  They will fall off by themselves. Activity Walk each and every day, increasing distance each day. No lifting greater than 5 lbs.  Avoid bending, arching, or twisting. No driving for 2 weeks; may ride as a passenger locally. If provided with back brace, wear when out of bed.  It is not necessary to wear in bed. Diet Resume your normal diet.  Return to Work Will be discussed at you follow up appointment. Call Your Doctor If Any of These Occur Redness, drainage, or swelling at the wound.  Temperature greater than 101 degrees. Severe pain not relieved by pain medication. Incision starts to come apart. Follow Up Appt Call today for appointment in 1-2 weeks ([phone]) or for problems.  If you have any hardware placed in your spine, you will need an x-ray before your appointment.

## 2014-03-17 NOTE — Progress Notes (Signed)
Pt. discharged home accompanied by spouse. Prescriptions and discharge instructions given with verbalization of understanding. Incision site on back with no s/s of infection - no swelling, redness, bleeding, and/or drainage noted. Lumbar corset applied at discharged. Opportunity given to ask questions but no question asked. Pt. transported out of this unit in wheelchair by the nurse tech.

## 2014-03-17 NOTE — Discharge Summary (Signed)
Physician Discharge Summary  Patient ID: Keith Dawson MRN: [id number] DOB/AGE: 43/06/1971 43 y.o.  Admit date: 03/15/2014 Discharge date: 03/17/2014  Admission Diagnoses:  Discharge Diagnoses:  Active Problems:   S/P lumbar spinal fusion   Discharged Condition: good  Hospital Course: The patient was the hospital oriented when an uncomplicated lumbar decompression and fusion. Postoperatively is doing very well. Back and leg pain improved. Ready for discharge home.  Consults:  Significant Diagnostic Studies:   Treatments:   Discharge Exam: Blood pressure 112/75, pulse 78, temperature 98.1 F (36.7 C), temperature source Oral, resp. rate 20, weight 97.07 kg (214 lb), SpO2 93.00%. Awake and alert. Oriented and appropriate. Motor and sensory function intact. Wound clean and dry. Chest and abdomen benign.  Disposition: Final discharge disposition not confirmed     Medication List         diazepam 5 MG tablet  Commonly known as:  VALIUM  Take 1-2 tablets (5-10 mg total) by mouth every 6 (six) hours as needed for muscle spasms.     meloxicam 7.5 MG tablet  Commonly known as:  MOBIC  Take 7.5 mg by mouth 2 (two) times daily.     morphine 15 MG 12 hr tablet  Commonly known as:  MS CONTIN  Take 1 tablet (15 mg total) by mouth every 12 (twelve) hours as needed for pain.     nortriptyline 50 MG capsule  Commonly known as:  PAMELOR  Take 50 mg by mouth at bedtime.     oxyCODONE-acetaminophen 5-325 MG per tablet  Commonly known as:  PERCOCET/ROXICET  Take 1-2 tablets by mouth every 4 (four) hours as needed for moderate pain.           Follow-up Information   Follow up with Tia AlertJONES,DAVID S, MD.   Specialty:  Neurosurgery   Contact information:   [address] [place] [address] [phone]       Signed: Kathaleen MaserHenry A Nasri Boakye 03/17/2014, 10:55 AM

## 2014-03-19 ENCOUNTER — Encounter (HOSPITAL_COMMUNITY): Payer: Self-pay | Admitting: Neurological Surgery

## 2014-03-21 ENCOUNTER — Inpatient Hospital Stay (HOSPITAL_COMMUNITY)
Admission: EM | Admit: 2014-03-21 | Discharge: 2014-03-23 | DRG: 909 | Disposition: A | Discharge status: Home / Self Care | Payer: Medicare Other | Attending: Neurological Surgery | Admitting: Neurological Surgery

## 2014-03-21 ENCOUNTER — Emergency Department (HOSPITAL_COMMUNITY): Payer: Medicare Other

## 2014-03-21 ENCOUNTER — Encounter (HOSPITAL_COMMUNITY): Payer: Self-pay | Admitting: Emergency Medicine

## 2014-03-21 DIAGNOSIS — Z981 Arthrodesis status: Secondary | ICD-10-CM | POA: Diagnosis not present

## 2014-03-21 DIAGNOSIS — G062 Extradural and subdural abscess, unspecified: Secondary | ICD-10-CM

## 2014-03-21 DIAGNOSIS — F17200 Nicotine dependence, unspecified, uncomplicated: Secondary | ICD-10-CM | POA: Diagnosis not present

## 2014-03-21 DIAGNOSIS — R209 Unspecified disturbances of skin sensation: Secondary | ICD-10-CM | POA: Diagnosis not present

## 2014-03-21 DIAGNOSIS — M519 Unspecified thoracic, thoracolumbar and lumbosacral intervertebral disc disorder: Secondary | ICD-10-CM | POA: Diagnosis not present

## 2014-03-21 DIAGNOSIS — M47817 Spondylosis without myelopathy or radiculopathy, lumbosacral region: Secondary | ICD-10-CM | POA: Diagnosis not present

## 2014-03-21 DIAGNOSIS — IMO0002 Reserved for concepts with insufficient information to code with codable children: Principal | ICD-10-CM | POA: Diagnosis present

## 2014-03-21 DIAGNOSIS — S064X9A Epidural hemorrhage with loss of consciousness of unspecified duration, initial encounter: Secondary | ICD-10-CM | POA: Diagnosis present

## 2014-03-21 DIAGNOSIS — G9601 Cranial cerebrospinal fluid leak, spontaneous: Secondary | ICD-10-CM | POA: Diagnosis not present

## 2014-03-21 DIAGNOSIS — S064XAA Epidural hemorrhage with loss of consciousness status unknown, initial encounter: Secondary | ICD-10-CM | POA: Diagnosis present

## 2014-03-21 DIAGNOSIS — M79609 Pain in unspecified limb: Secondary | ICD-10-CM | POA: Diagnosis present

## 2014-03-21 DIAGNOSIS — Y831 Surgical operation with implant of artificial internal device as the cause of abnormal reaction of the patient, or of later complication, without mention of misadventure at the time of the procedure: Secondary | ICD-10-CM | POA: Diagnosis present

## 2014-03-21 LAB — CBC WITH DIFFERENTIAL/PLATELET
Basophils Absolute: 0 10*3/uL (ref 0.0–0.1)
Basophils Relative: 0 % (ref 0–1)
EOS ABS: 0.3 10*3/uL (ref 0.0–0.7)
EOS PCT: 2 % (ref 0–5)
HCT: 36.1 % — ABNORMAL LOW (ref 39.0–52.0)
HEMOGLOBIN: 12.4 g/dL — AB (ref 13.0–17.0)
LYMPHS ABS: 5 10*3/uL — AB (ref 0.7–4.0)
Lymphocytes Relative: 30 % (ref 12–46)
MCH: 31.6 pg (ref 26.0–34.0)
MCHC: 34.3 g/dL (ref 30.0–36.0)
MCV: 91.9 fL (ref 78.0–100.0)
MONO ABS: 1.5 10*3/uL — AB (ref 0.1–1.0)
MONOS PCT: 9 % (ref 3–12)
NEUTROS PCT: 59 % (ref 43–77)
Neutro Abs: 9.7 10*3/uL — ABNORMAL HIGH (ref 1.7–7.7)
Platelets: 215 10*3/uL (ref 150–400)
RBC: 3.93 MIL/uL — ABNORMAL LOW (ref 4.22–5.81)
RDW: 14.2 % (ref 11.5–15.5)
WBC: 16.5 10*3/uL — ABNORMAL HIGH (ref 4.0–10.5)

## 2014-03-21 LAB — BASIC METABOLIC PANEL
BUN: 19 mg/dL (ref 6–23)
CO2: 24 mEq/L (ref 19–32)
Calcium: 8.9 mg/dL (ref 8.4–10.5)
Chloride: 102 mEq/L (ref 96–112)
Creatinine, Ser: 0.97 mg/dL (ref 0.50–1.35)
GFR calc Af Amer: >90 mL/min (ref >90)
GLUCOSE: 101 mg/dL — AB (ref 70–99)
Potassium: 4.3 mEq/L (ref 3.7–5.3)
Sodium: 137 mEq/L (ref 137–147)

## 2014-03-21 MED ORDER — HYDROMORPHONE HCL PF 1 MG/ML IJ SOLN
1.0000 mg | Freq: Once | INTRAMUSCULAR | Status: AC
  Administered 2014-03-21: 1 mg via INTRAVENOUS
  Filled 2014-03-21: qty 1

## 2014-03-21 MED ORDER — VANCOMYCIN HCL IN DEXTROSE 1-5 GM/200ML-% IV SOLN
1000.0000 mg | Freq: Once | INTRAVENOUS | Status: AC
  Administered 2014-03-22: 1000 mg via INTRAVENOUS
  Filled 2014-03-21: qty 200

## 2014-03-21 MED ORDER — GADOBENATE DIMEGLUMINE 529 MG/ML IV SOLN
20.0000 mL | Freq: Once | INTRAVENOUS | Status: AC
  Administered 2014-03-21: 20 mL via INTRAVENOUS

## 2014-03-21 MED ORDER — CEFTRIAXONE SODIUM 1 G IJ SOLR
1.0000 g | Freq: Once | INTRAMUSCULAR | Status: AC
  Administered 2014-03-22: 1 g via INTRAVENOUS
  Filled 2014-03-21: qty 10

## 2014-03-21 NOTE — ED Notes (Signed)
Pt. reports progressing pain at lower back unrelieved by prescription medications , denies recent fall or injury , s/p back surgery 03/15/2014 . No dysuria or hematuria. Denies fever or chills.

## 2014-03-21 NOTE — ED Notes (Signed)
Pt back from MRI 

## 2014-03-21 NOTE — ED Provider Notes (Signed)
CSN: [id number]     Arrival date & time 03/21/14  1946 History   First MD Initiated Contact with Patient 03/21/14 2041     Chief Complaint  Patient presents with  . Back Pain     (Consider location/radiation/quality/duration/timing/severity/associated sxs/prior Treatment) HPI  This is a 43 y.o. male with PMH osteoarthritis and disc disease of the lower back 6 days sp L4-L5, L5-S1 decompression, fusion, presenting today with back pain, weakness, numbness.  Onset after surgery, but has been progressing quickly in severity in the last two days. Located around incision site, as well as left buttocks.  Persistent.  Sharp, throbbing.  Alleviated but not resolved with home ms contin, percocet.  Pt has numbness of left small toe after surgery but now has decreased sensation throughout all digits of the left foot.  He also endorses frank weakness in the LLE.  Negative for perianal or perigenital changes in sensation, incontinence of bowel, or retention or incontinence of urine.  Past Medical History  Diagnosis Date  . Headache(784.0)   . Arthritis     back   Past Surgical History  Procedure Laterality Date  . Knee surgery Left   . Maximum access (mas)posterior lumbar interbody fusion (plif) 2 level N/A 03/15/2014    Procedure: FOR MAXIMUM ACCESS (MAS) POSTERIOR LUMBAR INTERBODY FUSION (PLIF) 2 LEVEL four/five five/sacral one;  Surgeon: Tia Alert, MD;  Location: [facility];  Service: Neurosurgery;  Laterality: N/A;   No family history on file. History  Substance Use Topics  . Smoking status: Current Every Day Smoker -- 1.00 packs/day for 26 years  . Smokeless tobacco: Never Used  . Alcohol Use: Yes     Comment: social    Review of Systems  Constitutional: Negative for fever and chills.  HENT: Negative for facial swelling.   Eyes: Negative for pain and visual disturbance.  Respiratory: Negative for chest tightness and shortness of breath.   Cardiovascular: Negative for chest pain.   Gastrointestinal: Negative for nausea and vomiting.  Genitourinary: Negative for dysuria.  Musculoskeletal: Positive for back pain.  Neurological: Positive for weakness and numbness. Negative for headaches.  Psychiatric/Behavioral: Negative for behavioral problems.      Allergies  Codeine  Home Medications   Prior to Admission medications   Medication Sig Start Date End Date Taking? Authorizing Provider  bisacodyl (DULCOLAX) 5 MG EC tablet Take 5 mg by mouth daily.   Yes Historical Provider, MD  diazepam (VALIUM) 5 MG tablet Take 1-2 tablets (5-10 mg total) by mouth every 6 (six) hours as needed for muscle spasms. 03/17/14  Yes Temple Pacini, MD  morphine (MS CONTIN) 15 MG 12 hr tablet Take 1 tablet (15 mg total) by mouth every 12 (twelve) hours as needed for pain. 03/17/14  Yes Temple Pacini, MD  oxyCODONE-acetaminophen (PERCOCET/ROXICET) 5-325 MG per tablet Take 1-2 tablets by mouth every 4 (four) hours as needed for moderate pain. 03/17/14  Yes Temple Pacini, MD  polyethylene glycol (MIRALAX / GLYCOLAX) packet Take 17 g by mouth daily.   Yes Historical Provider, MD  meloxicam (MOBIC) 7.5 MG tablet Take 7.5 mg by mouth 2 (two) times daily.    Historical Provider, MD  nortriptyline (PAMELOR) 50 MG capsule Take 50 mg by mouth at bedtime.    Historical Provider, MD   BP 101/60  Pulse 88  Temp(Src) 99.5 F (37.5 C) (Rectal)  Resp 22  Ht 6\' 1"  (1.854 m)  Wt 205 lb (92.987 kg)  BMI 27.05  kg/m2  SpO2 97% Physical Exam  Constitutional: He is oriented to person, place, and time. He appears well-developed and well-nourished. No distress.  HENT:  Head: Normocephalic and atraumatic.  Mouth/Throat: No oropharyngeal exudate.  Eyes: Conjunctivae are normal. Pupils are equal, round, and reactive to light. No scleral icterus.  Neck: Normal range of motion. No tracheal deviation present. No thyromegaly present.  Cardiovascular: Normal rate, regular rhythm and normal heart sounds.  Exam reveals no  gallop and no friction rub.   No murmur heard. Pulmonary/Chest: Effort normal and breath sounds normal. No stridor. No respiratory distress. He has no wheezes. He has no rales. He exhibits no tenderness.  Abdominal: Soft. He exhibits no distension and no mass. There is no tenderness. There is no rebound and no guarding.  Musculoskeletal: Normal range of motion. He exhibits no edema.  Neurological: He is alert and oriented to person, place, and time. A sensory deficit (decreased sensation of the left foot) is present. No cranial nerve deficit. Coordination normal. GCS eye subscore is 4. GCS verbal subscore is 5. GCS motor subscore is 6.  Decreased strength in the left foot  Skin: Skin is warm and dry. He is not diaphoretic.  Wound is erythematous, warm, painful    ED Course  Procedures (including critical care time) Labs Review Labs Reviewed  CBC WITH DIFFERENTIAL - Abnormal; Notable for the following:    WBC 16.5 (*)    RBC 3.93 (*)    Hemoglobin 12.4 (*)    HCT 36.1 (*)    Neutro Abs 9.7 (*)    Lymphs Abs 5.0 (*)    Monocytes Absolute 1.5 (*)    All other components within normal limits  BASIC METABOLIC PANEL    Imaging Review No results found.   EKG Interpretation None      MDM   Final diagnoses:  None    This is a 43 y.o. male with PMH osteoarthritis and disc disease of the lower back 6 days sp L4-L5, L5-S1 decompression, fusion, presenting today with back pain, weakness, numbness.  Onset after surgery, but has been progressing quickly in severity in the last two days. Located around incision site, as well as left buttocks.  Persistent.  Sharp, throbbing.  Alleviated but not resolved with home ms contin, percocet.  Pt has numbness of left small toe after surgery but now has decreased sensation throughout all digits of the left foot.  He also endorses frank weakness in the LLE.  Negative for perianal or perigenital changes in sensation, incontinence of bowel, or retention  or incontinence of urine.  Considering progressive weakness and numbness in the left lower extremity as well as significant warmth, erythema, pain surrounding the incision site from recent decompression and fusion, I believe that MRI of the lumbar spine is indicated at this time. I will provide the patient with IV Dilaudid at this time for analgesia.  I have ordered a metabolic panel as well as CBC which reveals a leukocytosis of 16.5. We'll continue to monitor closely, followup with MRI lumbar spine.  MRI of the lumbar spine reveals 4.9 x 1.8 x 1.8 cm collection within the dorsal epidural space at the L4-5 levels, highly concerning for epidural abscess. Considering leukocytosis, new neurologic findings, MRI finding concerning for epidural abscess, following recent spinal surgery, neurosurgery has been consulted.  We have started patient on antibiotics. Neurosurgery is admitting the patient.  Patient is being transoported in stable condition.  I have discussed case and care has been guided  by my attending physician, Dr. Juleen ChinaKohut.  Loma BostonStirling Dickie Cloe, MD 03/22/14 [phone]

## 2014-03-21 NOTE — ED Notes (Addendum)
Pt states that he had a double fusion of his back last Thursday with Dr Yetta BarreJones. States that he is experiencing severe back pain and since the surgery he has had progressively getting worse numbness down his left leg. States that he has only had one BM since the surgery. States that he takes oxycodone, morphine and valium as scheduled. States that he called Dr Barnett ApplebaumJones's office after hours and was told to come to the ED. States that he has not been walking or moving around, just gets out of bed to stretch a little.

## 2014-03-21 NOTE — ED Provider Notes (Signed)
I saw and evaluated the patient, reviewed the resident's note and I agree with the findings and plan.   EKG Interpretation None      Mr Lumbar Spine W Contrast  03/21/2014   CLINICAL DATA:  Progressive pain in lower back status post recent back surgery on 03/15/2014.  EXAM: MRI LUMBAR SPINE WITH CONTRAST  TECHNIQUE: Multiplanar and multiecho pulse sequences of the lumbar spine were obtained with intravenous contrast.  CONTRAST:  20mL MULTIHANCE GADOBENATE DIMEGLUMINE 529 MG/ML IV SOLN  COMPARISON:  Prior study from 03/15/2014.  FINDINGS: For the purposes of this dictation, the lowest well-formed intervertebral disc spaces presumed to be the L5-S1 level, and there presumed to be 5 lumbar type vertebral bodies.  The vertebral bodies are normally aligned with preservation of the normal lumbar lordosis. Vertebral body heights are well maintained. Signal intensity within the vertebral body bone marrow is within normal limits. The conus medullaris terminates at the L1 level.  Postoperative changes from recent posterior spinal decompression with fusion present at L4 thru S1. Bilateral transpedicular screws present at L4, L5, and S1. Intervertebral disc spacers present at L4-5 and L5-S1. The pedicular screws appear well positioned without encroachment on to the spinal canal. A T1 hypo intense, T2 hyperintense nonenhancing collection with internal components measuring 4.9 (craniocaudad) x 1.8 (transverse) x 1.8 (AP) cm is seen within the epidural space at the dorsal aspect of the thecal sac (series 6, image 12). The superior aspect of the collection begins at the level of the mid L4 level. The inferior aspect of the collection terminates at the level of the L5-S1 intervertebral disc space. There is secondary severe compression of the nerve roots of the cauda equina at the level of the L4-5 intervertebral disc space as well as the L5 vertebral body. This is best appreciated on sagittal T2 weighted sequence (series 3,  image 8).  At L1-2, there is minimal degenerative disc bulge without focal disc protrusion. No significant facet arthrosis. No canal or neural foraminal stenosis.  At L2-3, no disc bulge or disc protrusion. The intervertebral disc is well hydrated. No significant facet arthrosis. No canal or neural foraminal stenosis.  At L3-4, no disc bulge or disc protrusion. Mild bilateral facet hypertrophy present with resultant mild bilateral foraminal stenosis. No significant canal stenosis.  IMPRESSION: 1. 4.9 x 1.8 x 1.8 cm collection within the dorsal epidural space at the L4-5 levels as above, highly concerning for epidural abscess. There is secondary severe compression upon the nerve roots of the cauda equina which are displaced and compressed anteriorly. 2. Postoperative changes from recent posterior spinal decompression with fixation at the L4 thru S1 levels. 3. Mild multilevel degenerative changes as above.   Electronically Signed   By: Rise MuBenjamin  McClintock M.D.   On: 03/21/2014 [id number]    43 year old male with increasing back pain and numbness in his left foot. His status post L4-5/L5-S1 laminectomy/fusion approximately one week ago. He denies any interim trauma. No fevers or chills. No urinary complaints. On exam his incision is intact but slightly raised, erythematous and tender to palpation. Decreased sensation to light touch to his left foot. Weakness with plantar flexion as compared to the right side. Cannot convincingly obtain patellar reflexes on either side. Palpable DP pulses bilaterally. MRI was obtained. Concerning for possible spinal epidural abscess. Discussed case with Dr. Jeral FruitBotero, neurosurgery. Will evaluate patient. Antibiotics ordered. Admission.  CRITICAL CARE Performed by: Raeford RazorStephen Lashana Spang Total critical care time: 35 minutes Critical care time was exclusive of separately billable  procedures and treating other patients. Critical care was necessary to treat or prevent imminent or life-threatening  deterioration. Critical care was time spent personally by me on the following activities: development of treatment plan with patient and/or surrogate as well as nursing, discussions with consultants, evaluation of patient's response to treatment, examination of patient, obtaining history from patient or surrogate, ordering and performing treatments and interventions, ordering and review of laboratory studies, ordering and review of radiographic studies, pulse oximetry and re-evaluation of patient's condition.   Raeford RazorStephen Demeka Sutter, MD 03/22/14 [phone]

## 2014-03-22 ENCOUNTER — Encounter (HOSPITAL_COMMUNITY): Payer: Self-pay | Admitting: Certified Registered"

## 2014-03-22 ENCOUNTER — Encounter (HOSPITAL_COMMUNITY)
Admission: EM | Disposition: A | Discharge status: Home / Self Care | Payer: Self-pay | Source: Home / Self Care | Attending: Neurological Surgery

## 2014-03-22 ENCOUNTER — Encounter (HOSPITAL_COMMUNITY): Payer: Medicare Other | Admitting: Certified Registered"

## 2014-03-22 ENCOUNTER — Inpatient Hospital Stay (HOSPITAL_COMMUNITY): Payer: Medicare Other

## 2014-03-22 ENCOUNTER — Emergency Department (HOSPITAL_COMMUNITY): Payer: Medicare Other | Admitting: Certified Registered"

## 2014-03-22 DIAGNOSIS — S064XAA Epidural hemorrhage with loss of consciousness status unknown, initial encounter: Secondary | ICD-10-CM | POA: Diagnosis present

## 2014-03-22 DIAGNOSIS — M519 Unspecified thoracic, thoracolumbar and lumbosacral intervertebral disc disorder: Secondary | ICD-10-CM | POA: Diagnosis not present

## 2014-03-22 DIAGNOSIS — Z981 Arthrodesis status: Secondary | ICD-10-CM | POA: Diagnosis not present

## 2014-03-22 DIAGNOSIS — S064X9A Epidural hemorrhage with loss of consciousness of unspecified duration, initial encounter: Secondary | ICD-10-CM | POA: Diagnosis present

## 2014-03-22 DIAGNOSIS — F17200 Nicotine dependence, unspecified, uncomplicated: Secondary | ICD-10-CM | POA: Diagnosis not present

## 2014-03-22 DIAGNOSIS — R209 Unspecified disturbances of skin sensation: Secondary | ICD-10-CM | POA: Diagnosis not present

## 2014-03-22 DIAGNOSIS — IMO0002 Reserved for concepts with insufficient information to code with codable children: Secondary | ICD-10-CM | POA: Diagnosis not present

## 2014-03-22 HISTORY — PX: LAMINECTOMY: SHX219

## 2014-03-22 LAB — GRAM STAIN

## 2014-03-22 SURGERY — LUMBAR LAMINECTOMY FOR TUMOR
Anesthesia: General | Site: Back

## 2014-03-22 MED ORDER — PROPOFOL 10 MG/ML IV BOLUS
INTRAVENOUS | Status: AC
  Filled 2014-03-22: qty 20

## 2014-03-22 MED ORDER — HYDROMORPHONE HCL PF 1 MG/ML IJ SOLN
INTRAMUSCULAR | Status: AC
  Filled 2014-03-22: qty 1

## 2014-03-22 MED ORDER — SUCCINYLCHOLINE CHLORIDE 20 MG/ML IJ SOLN
INTRAMUSCULAR | Status: DC | PRN
  Administered 2014-03-22: 140 mg via INTRAVENOUS

## 2014-03-22 MED ORDER — ACETAMINOPHEN 650 MG RE SUPP: 650.0000 mg | RECTAL | Status: DC | PRN

## 2014-03-22 MED ORDER — MIDAZOLAM HCL 2 MG/2ML IJ SOLN
INTRAMUSCULAR | Status: AC
  Filled 2014-03-22: qty 2

## 2014-03-22 MED ORDER — LIDOCAINE HCL (CARDIAC) 20 MG/ML IV SOLN
INTRAVENOUS | Status: AC
  Filled 2014-03-22: qty 5

## 2014-03-22 MED ORDER — THROMBIN 20000 UNITS EX KIT
PACK | CUTANEOUS | Status: DC | PRN
  Administered 2014-03-22: 02:00:00 via TOPICAL

## 2014-03-22 MED ORDER — MIDAZOLAM HCL 5 MG/5ML IJ SOLN
INTRAMUSCULAR | Status: DC | PRN
  Administered 2014-03-22: 2 mg via INTRAVENOUS

## 2014-03-22 MED ORDER — VANCOMYCIN HCL IN DEXTROSE 1-5 GM/200ML-% IV SOLN
1000.0000 mg | Freq: Three times a day (TID) | INTRAVENOUS | Status: DC
  Administered 2014-03-22 – 2014-03-23 (×5): 1000 mg via INTRAVENOUS
  Filled 2014-03-22 (×6): qty 200

## 2014-03-22 MED ORDER — MORPHINE SULFATE 2 MG/ML IJ SOLN
1.0000 mg | INTRAMUSCULAR | Status: DC | PRN
  Administered 2014-03-22: 2 mg via INTRAVENOUS
  Administered 2014-03-22 (×2): 4 mg via INTRAVENOUS
  Administered 2014-03-22: 2 mg via INTRAVENOUS
  Filled 2014-03-22: qty 2
  Filled 2014-03-22 (×2): qty 1
  Filled 2014-03-22: qty 2

## 2014-03-22 MED ORDER — SODIUM CHLORIDE 0.9 % IJ SOLN
INTRAMUSCULAR | Status: AC
  Filled 2014-03-22: qty 10

## 2014-03-22 MED ORDER — DEXAMETHASONE SODIUM PHOSPHATE 4 MG/ML IJ SOLN
INTRAMUSCULAR | Status: DC | PRN
  Administered 2014-03-22: 4 mg via INTRAVENOUS

## 2014-03-22 MED ORDER — ZOLPIDEM TARTRATE 5 MG PO TABS: 10.0000 mg | ORAL_TABLET | Freq: Every evening | ORAL | Status: DC | PRN

## 2014-03-22 MED ORDER — SODIUM CHLORIDE 0.9 % IV SOLN
INTRAVENOUS | Status: DC
  Administered 2014-03-22: 1000 mL via INTRAVENOUS

## 2014-03-22 MED ORDER — 0.9 % SODIUM CHLORIDE (POUR BTL) OPTIME
TOPICAL | Status: DC | PRN
  Administered 2014-03-22: 1000 mL

## 2014-03-22 MED ORDER — OXYCODONE HCL 5 MG PO TABS
5.0000 mg | ORAL_TABLET | Freq: Once | ORAL | Status: AC | PRN
  Administered 2014-03-22: 5 mg via ORAL

## 2014-03-22 MED ORDER — SODIUM CHLORIDE 0.9 % IJ SOLN
3.0000 mL | Freq: Two times a day (BID) | INTRAMUSCULAR | Status: DC
  Administered 2014-03-23: 3 mL via INTRAVENOUS

## 2014-03-22 MED ORDER — LIDOCAINE HCL (CARDIAC) 20 MG/ML IV SOLN
INTRAVENOUS | Status: DC | PRN
  Administered 2014-03-22: 100 mg via INTRAVENOUS

## 2014-03-22 MED ORDER — SODIUM CHLORIDE 0.9 % IV SOLN
250.0000 mL | INTRAVENOUS | Status: DC
  Administered 2014-03-22: 250 mL via INTRAVENOUS

## 2014-03-22 MED ORDER — SODIUM CHLORIDE 0.9 % IJ SOLN: 3.0000 mL | INTRAMUSCULAR | Status: DC | PRN

## 2014-03-22 MED ORDER — OXYCODONE HCL 5 MG/5ML PO SOLN: 5.0000 mg | Freq: Once | ORAL | Status: AC | PRN

## 2014-03-22 MED ORDER — BACITRACIN 50000 UNITS IM SOLR
INTRAMUSCULAR | Status: DC | PRN
  Administered 2014-03-22: 02:00:00

## 2014-03-22 MED ORDER — DEXAMETHASONE SODIUM PHOSPHATE 4 MG/ML IJ SOLN
INTRAMUSCULAR | Status: AC
  Filled 2014-03-22: qty 1

## 2014-03-22 MED ORDER — PROPOFOL 10 MG/ML IV BOLUS
INTRAVENOUS | Status: DC | PRN
  Administered 2014-03-22: 200 mg via INTRAVENOUS

## 2014-03-22 MED ORDER — SUCCINYLCHOLINE CHLORIDE 20 MG/ML IJ SOLN
INTRAMUSCULAR | Status: AC
  Filled 2014-03-22: qty 1

## 2014-03-22 MED ORDER — SUFENTANIL CITRATE 50 MCG/ML IV SOLN
INTRAVENOUS | Status: DC | PRN
  Administered 2014-03-22: 10 ug via INTRAVENOUS
  Administered 2014-03-22: 20 ug via INTRAVENOUS

## 2014-03-22 MED ORDER — METOCLOPRAMIDE HCL 5 MG/ML IJ SOLN: 10.0000 mg | Freq: Once | INTRAMUSCULAR | Status: DC | PRN

## 2014-03-22 MED ORDER — ONDANSETRON HCL 4 MG/2ML IJ SOLN
INTRAMUSCULAR | Status: DC | PRN
  Administered 2014-03-22: 4 mg via INTRAVENOUS

## 2014-03-22 MED ORDER — SUFENTANIL CITRATE 50 MCG/ML IV SOLN
INTRAVENOUS | Status: AC
  Filled 2014-03-22: qty 1

## 2014-03-22 MED ORDER — GENTAMICIN SULFATE 40 MG/ML IJ SOLN
600.0000 mg | Freq: Once | INTRAVENOUS | Status: AC
  Administered 2014-03-22: 600 mg via INTRAVENOUS
  Filled 2014-03-22: qty 15

## 2014-03-22 MED ORDER — SODIUM CHLORIDE 0.9 % IV SOLN
INTRAVENOUS | Status: DC | PRN
  Administered 2014-03-22: 01:00:00 via INTRAVENOUS

## 2014-03-22 MED ORDER — MENTHOL 3 MG MT LOZG: 1.0000 | LOZENGE | OROMUCOSAL | Status: DC | PRN

## 2014-03-22 MED ORDER — DIAZEPAM 5 MG PO TABS
5.0000 mg | ORAL_TABLET | Freq: Four times a day (QID) | ORAL | Status: DC | PRN
  Administered 2014-03-23: 5 mg via ORAL
  Filled 2014-03-22: qty 1

## 2014-03-22 MED ORDER — ACETAMINOPHEN 325 MG PO TABS: 650.0000 mg | ORAL_TABLET | ORAL | Status: DC | PRN

## 2014-03-22 MED ORDER — OXYCODONE HCL 5 MG PO TABS
ORAL_TABLET | ORAL | Status: AC
  Filled 2014-03-22: qty 1

## 2014-03-22 MED ORDER — CEFAZOLIN SODIUM 1-5 GM-% IV SOLN
1.0000 g | Freq: Three times a day (TID) | INTRAVENOUS | Status: AC
  Administered 2014-03-22 (×2): 1 g via INTRAVENOUS
  Filled 2014-03-22 (×3): qty 50

## 2014-03-22 MED ORDER — OXYCODONE-ACETAMINOPHEN 5-325 MG PO TABS
1.0000 | ORAL_TABLET | ORAL | Status: DC | PRN
  Administered 2014-03-22 – 2014-03-23 (×5): 2 via ORAL
  Filled 2014-03-22 (×5): qty 2

## 2014-03-22 MED ORDER — MORPHINE SULFATE 4 MG/ML IJ SOLN: 4.0000 mg | Freq: Once | INTRAMUSCULAR | Status: DC

## 2014-03-22 MED ORDER — PHENOL 1.4 % MT LIQD: 1.0000 | OROMUCOSAL | Status: DC | PRN

## 2014-03-22 MED ORDER — HYDROMORPHONE HCL PF 1 MG/ML IJ SOLN
0.2500 mg | INTRAMUSCULAR | Status: DC | PRN
  Administered 2014-03-22 (×4): 0.5 mg via INTRAVENOUS

## 2014-03-22 MED ORDER — ONDANSETRON HCL 4 MG/2ML IJ SOLN
INTRAMUSCULAR | Status: AC
  Filled 2014-03-22: qty 2

## 2014-03-22 MED ORDER — ONDANSETRON HCL 4 MG/2ML IJ SOLN: 4.0000 mg | INTRAMUSCULAR | Status: DC | PRN

## 2014-03-22 SURGICAL SUPPLY — 75 items
APL SKNCLS STERI-STRIP NONHPOA (GAUZE/BANDAGES/DRESSINGS) ×1
BAG DECANTER FOR FLEXI CONT (MISCELLANEOUS) ×3 IMPLANT
BENZOIN TINCTURE PRP APPL 2/3 (GAUZE/BANDAGES/DRESSINGS) ×3 IMPLANT
BLADE SURG 11 STRL SS (BLADE) IMPLANT
BLADE SURG ROTATE 9660 (MISCELLANEOUS) IMPLANT
BLADE ULTRA TIP 2M (BLADE) IMPLANT
BRUSH SCRUB EZ 1% IODOPHOR (MISCELLANEOUS) ×3 IMPLANT
BRUSH SCRUB EZ PLAIN DRY (MISCELLANEOUS) ×1 IMPLANT
BUR ACORN 6.0 (BURR) IMPLANT
BUR ACORN 6.0MM (BURR)
BUR MATCHSTICK NEURO 3.0 LAGG (BURR) IMPLANT
CANISTER SUCT 3000ML (MISCELLANEOUS) ×3 IMPLANT
CLOSURE WOUND 1/2 X4 (GAUZE/BANDAGES/DRESSINGS) ×1
CONT SPEC 4OZ CLIKSEAL STRL BL (MISCELLANEOUS) ×1 IMPLANT
DRAPE LAPAROTOMY 100X72X124 (DRAPES) ×3 IMPLANT
DRAPE LAPAROTOMY T 102X78X121 (DRAPES) IMPLANT
DRAPE MICROSCOPE LEICA (MISCELLANEOUS) ×1 IMPLANT
DRAPE POUCH INSTRU U-SHP 10X18 (DRAPES) ×3 IMPLANT
DRSG EMULSION OIL 3X3 NADH (GAUZE/BANDAGES/DRESSINGS) IMPLANT
ELECT REM PT RETURN 9FT ADLT (ELECTROSURGICAL) ×3
ELECTRODE REM PT RTRN 9FT ADLT (ELECTROSURGICAL) ×1 IMPLANT
GAUZE SPONGE 4X4 16PLY XRAY LF (GAUZE/BANDAGES/DRESSINGS) IMPLANT
GLOVE BIO SURGEON STRL SZ7 (GLOVE) ×2 IMPLANT
GLOVE BIOGEL M 8.0 STRL (GLOVE) ×3 IMPLANT
GLOVE EXAM NITRILE LRG STRL (GLOVE) IMPLANT
GLOVE EXAM NITRILE MD LF STRL (GLOVE) IMPLANT
GLOVE EXAM NITRILE XL STR (GLOVE) IMPLANT
GLOVE EXAM NITRILE XS STR PU (GLOVE) IMPLANT
GLOVE INDICATOR 7.5 STRL GRN (GLOVE) ×2 IMPLANT
GOWN BRE IMP SLV AUR LG STRL (GOWN DISPOSABLE) IMPLANT
GOWN BRE IMP SLV AUR XL STRL (GOWN DISPOSABLE) IMPLANT
GOWN STRL REIN 2XL LVL4 (GOWN DISPOSABLE) IMPLANT
GOWN STRL REUS W/ TWL LRG LVL3 (GOWN DISPOSABLE) IMPLANT
GOWN STRL REUS W/TWL LRG LVL3 (GOWN DISPOSABLE) ×6
HEMOSTAT SURGICEL 2X14 (HEMOSTASIS) IMPLANT
KIT BASIN OR (CUSTOM PROCEDURE TRAY) ×3 IMPLANT
KIT ROOM TURNOVER OR (KITS) ×3 IMPLANT
NDL HYPO 25X1 1.5 SAFETY (NEEDLE) ×1 IMPLANT
NDL SPNL 22GX3.5 QUINCKE BK (NEEDLE) IMPLANT
NEEDLE HYPO 22GX1.5 SAFETY (NEEDLE) ×1 IMPLANT
NEEDLE HYPO 25X1 1.5 SAFETY (NEEDLE) IMPLANT
NEEDLE SPNL 22GX3.5 QUINCKE BK (NEEDLE) IMPLANT
NS IRRIG 1000ML POUR BTL (IV SOLUTION) ×3 IMPLANT
PACK LAMINECTOMY NEURO (CUSTOM PROCEDURE TRAY) ×3 IMPLANT
PAD ABD 8X10 STRL (GAUZE/BANDAGES/DRESSINGS) ×2 IMPLANT
PAD EYE OVAL STERILE LF (GAUZE/BANDAGES/DRESSINGS) IMPLANT
PATTIES SURGICAL .5 X.5 (GAUZE/BANDAGES/DRESSINGS) IMPLANT
PATTIES SURGICAL .5 X3 (DISPOSABLE) ×3 IMPLANT
PATTIES SURGICAL 1/4 X 3 (GAUZE/BANDAGES/DRESSINGS) IMPLANT
RUBBERBAND STERILE (MISCELLANEOUS) ×2 IMPLANT
SPONGE GAUZE 4X4 12PLY (GAUZE/BANDAGES/DRESSINGS) ×3 IMPLANT
SPONGE LAP 4X18 X RAY DECT (DISPOSABLE) IMPLANT
SPONGE NEURO XRAY DETECT 1X3 (DISPOSABLE) IMPLANT
SPONGE SURGIFOAM ABS GEL 100 (HEMOSTASIS) IMPLANT
STAPLER VISISTAT 35W (STAPLE) ×1 IMPLANT
STRIP CLOSURE SKIN 1/2X4 (GAUZE/BANDAGES/DRESSINGS) ×2 IMPLANT
SUT BONE WAX W31G (SUTURE) IMPLANT
SUT ETHILON 4 0 PS 2 18 (SUTURE) ×1 IMPLANT
SUT NURALON 4 0 TR CR/8 (SUTURE) IMPLANT
SUT PROLENE 6 0 BV (SUTURE) IMPLANT
SUT VIC AB 0 CT1 18XCR BRD8 (SUTURE) ×1 IMPLANT
SUT VIC AB 0 CT1 8-18 (SUTURE) ×3
SUT VIC AB 2-0 CP2 18 (SUTURE) ×3 IMPLANT
SUT VIC AB 2-0 CT1 18 (SUTURE) ×1 IMPLANT
SUT VIC AB 3-0 SH 18 (SUTURE) ×2 IMPLANT
SYR 20ML ECCENTRIC (SYRINGE) IMPLANT
TAPE CLOTH SURG 4X10 WHT LF (GAUZE/BANDAGES/DRESSINGS) ×2 IMPLANT
TAPE STRIPS DRAPE STRL (GAUZE/BANDAGES/DRESSINGS) ×2 IMPLANT
TIP SONASTAR STD MISONIX 1.9 (TRAY / TRAY PROCEDURE) ×3 IMPLANT
TOWEL OR 17X24 6PK STRL BLUE (TOWEL DISPOSABLE) ×3 IMPLANT
TOWEL OR 17X26 10 PK STRL BLUE (TOWEL DISPOSABLE) ×3 IMPLANT
TRAY FOLEY CATH 14FRSI W/METER (CATHETERS) IMPLANT
TUBE CONNECTING 12'X1/4 (SUCTIONS)
TUBE CONNECTING 12X1/4 (SUCTIONS) IMPLANT
WATER STERILE IRR 1000ML POUR (IV SOLUTION) ×3 IMPLANT

## 2014-03-22 NOTE — Progress Notes (Signed)
PT Cancellation Note  Patient Details Name: Keith Dawson MRN: [id number] DOB: 09/20/1971   Cancelled Treatment:    Reason Eval/Treat Not Completed: Other (comment) (waiting for MRI results to proceed with evaluation) 03/22/2014  Golden BingKen Gesenia Bantz, PT [phone]  (pager)  Eliseo GumKenneth V Judyann Casasola 03/22/2014, 4:44 PM

## 2014-03-22 NOTE — Progress Notes (Signed)
UR complete.  Jerrett Baldinger RN, MSN 

## 2014-03-22 NOTE — Progress Notes (Signed)
Writer called MRI again to see regarding patient's MRI, was told there are emergencies before, but will get to him as soon as they can.Patient made aware

## 2014-03-22 NOTE — Transfer of Care (Signed)
Immediate Anesthesia Transfer of Care Note  Patient: Keith Dawson  Procedure(s) Performed: Procedure(s): irrigation and debriedment of lumbar wound (N/A)  Patient Location: PACU  Anesthesia Type:General  Level of Consciousness: awake, oriented, sedated, patient cooperative and responds to stimulation  Airway & Oxygen Therapy: Patient Spontanous Breathing and Patient connected to nasal cannula oxygen  Post-op Assessment: Report given to PACU RN, Post -op Vital signs reviewed and stable and Patient moving all extremities X 4  Post vital signs: Reviewed and stable  Complications: No apparent anesthesia complications

## 2014-03-22 NOTE — Progress Notes (Signed)
Patient ID: Keith Dawson, male   DOB: 06/07/1971, 43 y.o.   MRN: [id number] Found mr Keith Glassmanhelps lying in bed , crying because he can not feel his left leg or move. No changes in the right leg. hemovac with seroud drainage. Clinicallt, normal scrotal reflex, normal rectal tone. Sensory loss from left t12-l1  All the way to s2-3. Intact perineal sensation. Unable to move the leg but able to keep it in a flexion position with normal tone. dtr normal. Plan to get a liumbar mri asap

## 2014-03-22 NOTE — Progress Notes (Signed)
OT Cancellation Note  Patient Details Name: Keith Dawson MRN: [id number] DOB: 03/10/1971   Cancelled Treatment:    Reason Eval/Treat Not Completed: Medical issues which prohibited therapy - pt awaiting STAT MRI due to sensory and motor changes Lt. LE per MD notes.    Boykin ReaperWendi M Val Schiavo LeavenworthWendi Ashe Gago, OTR/L [phone] 03/22/2014, 12:42 PM

## 2014-03-22 NOTE — ED Provider Notes (Signed)
I saw and evaluated the patient, reviewed the resident's note and I agree with the findings and plan.   EKG Interpretation None      See other note.   Raeford RazorStephen Esias Mory, MD 03/22/14 [phone]

## 2014-03-22 NOTE — Anesthesia Preprocedure Evaluation (Signed)
Anesthesia Evaluation  Patient identified by MRN, date of birth, ID band Patient awake    Reviewed: Allergy & Precautions, H&P , NPO status , Patient's Chart, lab work & pertinent test results, reviewed documented beta blocker date and time   Airway Mallampati: II TM Distance: >3 FB Neck ROM: full    Dental   Pulmonary Current Smoker,  breath sounds clear to auscultation        Cardiovascular negative cardio ROS  Rhythm:regular     Neuro/Psych  Headaches, negative psych ROS   GI/Hepatic negative GI ROS, Neg liver ROS,   Endo/Other  negative endocrine ROS  Renal/GU negative Renal ROS  negative genitourinary   Musculoskeletal   Abdominal   Peds  Hematology negative hematology ROS (+)   Anesthesia Other Findings See surgeon's H&P   Reproductive/Obstetrics negative OB ROS                           Anesthesia Physical Anesthesia Plan  ASA: II and emergent  Anesthesia Plan: General   Post-op Pain Management:    Induction: Intravenous, Rapid sequence and Cricoid pressure planned  Airway Management Planned: Oral ETT  Additional Equipment:   Intra-op Plan:   Post-operative Plan: Extubation in OR  Informed Consent: I have reviewed the patients History and Physical, chart, labs and discussed the procedure including the risks, benefits and alternatives for the proposed anesthesia with the patient or authorized representative who has indicated his/her understanding and acceptance.   Dental Advisory Given  Plan Discussed with: CRNA and Surgeon  Anesthesia Plan Comments:         Anesthesia Quick Evaluation

## 2014-03-22 NOTE — Anesthesia Procedure Notes (Signed)
Procedure Name: Intubation Date/Time: 03/22/2014 1:37 AM Performed by: Melina SchoolsBANKS, Yvonnia Tango J Pre-anesthesia Checklist: Patient identified, Emergency Drugs available, Suction available and Patient being monitored Patient Re-evaluated:Patient Re-evaluated prior to inductionOxygen Delivery Method: Circle system utilized Preoxygenation: Pre-oxygenation with 100% oxygen Intubation Type: IV induction, Rapid sequence and Cricoid Pressure applied Ventilation: Mask ventilation without difficulty Laryngoscope Size: 3 and Mac Grade View: Grade I Tube type: Oral Tube size: 8.0 mm Number of attempts: 1 Airway Equipment and Method: Stylet Placement Confirmation: ETT inserted through vocal cords under direct vision,  positive ETCO2 and breath sounds checked- equal and bilateral Secured at: 24 cm Tube secured with: Tape Dental Injury: Teeth and Oropharynx as per pre-operative assessment

## 2014-03-22 NOTE — Progress Notes (Signed)
ANTIBIOTIC CONSULT NOTE - INITIAL  Pharmacy Consult for Vancomycin  Indication: Lumbar wound/epidural abscess   Allergies  Allergen Reactions  . Codeine Itching and Nausea And Vomiting   Patient Measurements: Height: 6\' 1"  (185.4 cm) Weight: 205 lb (92.987 kg) IBW/kg (Calculated) : 79.9  Vital Signs: Temp: 98.1 F (36.7 C) (04/16 0430) Temp src: Rectal (04/15 2137) BP: 114/68 mmHg (04/16 0430) Pulse Rate: 87 (04/16 0430)  Labs:  Recent Labs  03/21/14 2113  WBC 16.5*  HGB 12.4*  PLT 215  CREATININE 0.97   Estimated Creatinine Clearance: 112.1 ml/min (by C-G formula based on Cr of 0.97).  Medical History: Past Medical History  Diagnosis Date  . Headache(784.0)   . Arthritis     back   Assessment: 43 y/o M s/p lumbar fusion one week ago with progressive weakness in the left lower extremity. MRI reveals possible epidural abscess at the L4-5 level. Now s/p I&D in the OR. WBC 16.5, renal function good, other labs as above.   Goal of Therapy:  Vancomycin trough level 15-20 mcg/ml  Plan:  -Vancomycin 1000 mg IV q8h -Trend WBC, temp, renal function -Drug levels as indicated   Didier Brandenburg 03/22/2014,5:10 AM

## 2014-03-22 NOTE — Progress Notes (Signed)
Patient's family moved the patient's belongings to his new room.

## 2014-03-22 NOTE — Progress Notes (Addendum)
Went in to patient's room at approximately 8:30 this morning due to bed alarm sounding and lights flashing.  Patient was standing up out of the bed in the room reaching for telephone with SCD on right leg still attached and drain hanging down behind him.  Nsg tech entered the room shortly after as well.  When asked, patient states he was getting his telephone because it was ringing and he was awaiting a call from his lawyer.  Patient ambulated min guard back to the bed.  Climbed into bed without assist, rolled in the bed so that I could ensure that his drain had not been tangled or pulled out.  SCD reapplied to left lower extremity and bed alarm reset.  Educated patient on calling for assist and not getting OOB on his own.

## 2014-03-22 NOTE — Op Note (Signed)
NAMAlvera Singh:  [place], [name]               ACCOUNT NO.:  [id number]  MEDICAL RECORD NO.:  [id number]  LOCATION:  4N07C                        FACILITY:  [facility]  PHYSICIAN:  Hilda LiasErnesto Annalycia Done, M.D.   DATE OF BIRTH:  1971-08-25  DATE OF PROCEDURE:  03/22/2014 DATE OF DISCHARGE:                              OPERATIVE REPORT   PREOPERATIVE DIAGNOSIS:  Lumbar epidural hematoma, rule out infection.  POSTOPERATIVE DIAGNOSIS:  Lumbar epidural hematoma, rule out infection.  PROCEDURES:  Exploration of the lumbar wound.  Evacuation of epidural hematoma.  Insertion of a drain.  SURGEON:  Hilda LiasErnesto Fallen Crisostomo, M.D.  CLINICAL HISTORY:  [name] is a gentleman who a week ago had a fusion at the level L4-L5.  The patient went home, and his wife called me this afternoon telling me that he is having intense pain going to the left foot associated with weakness and numbness.  In the emergency room, he has had lumbar MRI which showed epidural collection of fluid, rule out infection.  Because of severe finding of compression of the thecal sac, surgery was advised.  I spoke with him, his wife, and his father.  PROCEDURE IN DETAIL:  The patient was taken to the OR, and after intubation, he was positioned in a prone manner.  The previous dressing was removed as well as the Steri-Strips.  The wound was cleaned with DuraPrep, and after 3 minutes, we applied the dressing.  Incision was made in the midline.  Immediately,some bloody fluid was found subcutaneous.  The fascia was opened, and cultures were taken.  There was no evidence of infection, but mostly epidural fluid collection, serosanguineous. Several cultures were taken.  Then, the area was irrigated with copious amount of saline solution.  There was no specific point of bleeding.  A large Hemovac was left in the epidural space, and the wound was closed back with Vicryl and Steri-Strips.  The patient is going to go to the floor.           ______________________________ Hilda LiasErnesto Raynee Mccasland, M.D.     EB/MEDQ  D:  03/22/2014  T:  03/22/2014  Job:  [id number]

## 2014-03-22 NOTE — Progress Notes (Signed)
Md called back and gave an order for Morphine 4mg  once.

## 2014-03-22 NOTE — Progress Notes (Signed)
Clinical Social Worker received referral for SNF placement. CSW reviewed chart and PT has not even seen patient. CSW will wait for PT/OT evaluation.    CSW will be available as needed, pending PT evaluation.  Maree KrabbeLindsay Taygen Dawson, MSW, Theresia MajorsLCSWA [phone]

## 2014-03-22 NOTE — Progress Notes (Signed)
Patient was informed that he will be transfering to room 4N31 when he returns from MRI. Patient and family verbalized understanding.

## 2014-03-22 NOTE — Progress Notes (Signed)
Patient has an order for MRI stat, writer called MRI to see when patient can be taken. Was told that there are a lot of line especially form the ED, But will get to patient as soon as they can. Patient notified.

## 2014-03-22 NOTE — Progress Notes (Signed)
Writer called from MRI that patient needed pain med before the procedure. Patient can only get oxycodone tab at this time. So Clinical research associatewriter called MD to get one time order for morphine.

## 2014-03-22 NOTE — Progress Notes (Signed)
Nutrition Brief Note  Patient identified on the Malnutrition Screening Tool (MST) Report  Wt Readings from Last 15 Encounters:  03/22/14 220 lb (99.791 kg)  03/22/14 220 lb (99.791 kg)  03/15/14 214 lb (97.07 kg)  03/15/14 214 lb (97.07 kg)  03/05/14 214 lb 1.6 oz (97.115 kg)  12/20/08 188 lb 8 oz (85.503 kg)  11/05/08 187 lb 14.4 oz (85.231 kg)  10/04/08 187 lb (84.823 kg)  08/31/08 185 lb 8 oz (84.142 kg)    Body mass index is 29.83 kg/(m^2). Patient meets criteria for Overweight based on current BMI. Pt reports usual body weigh of 226 to 230 lbs- states he has lost weight these past 2 weeks. No evidence of weight loss per chart. No signs of wasting per physical exam. He reports eating well PTA and denies decreased appetite.  Current diet order is Regular, patient is consuming approximately 80% of meals at this time. Encouraged healthful adequate PO intake to aid recovery. Labs and medications reviewed.   No nutrition interventions warranted at this time. If nutrition issues arise, please consult RD.   Ian Malkineanne Barnett RD, LDN Inpatient Clinical Dietitian Pager: [phone] After Hours Pager: [phone]

## 2014-03-22 NOTE — Progress Notes (Signed)
No weakness, no leg pain. Spoke with family

## 2014-03-22 NOTE — Progress Notes (Signed)
Writer called MRI to see if patient still needed the pain medicine and was told that they already started the procedure and will not be need for the medicine at this time.

## 2014-03-22 NOTE — H&P (Signed)
Keith Dawson is an 43 y.o. male.   Chief Complaint:    Intense pain left leg                                                                      HPI: patient who had lumbar fusion 1 week ago.lately he has noticed more weakness and pain in the left foot associated with numbness. Mri was done in the er  Past Medical History  Diagnosis Date  . Headache(784.0)   . Arthritis     back    Past Surgical History  Procedure Laterality Date  . Knee surgery Left   . Maximum access (mas)posterior lumbar interbody fusion (plif) 2 level N/A 03/15/2014    Procedure: FOR MAXIMUM ACCESS (MAS) POSTERIOR LUMBAR INTERBODY FUSION (PLIF) 2 LEVEL four/five five/sacral one;  Surgeon: Eustace Moore, MD;  Location: [facility];  Service: Neurosurgery;  Laterality: N/A;    No family history on file. Social History:  reports that he has been smoking.  He has never used smokeless tobacco. He reports that he drinks alcohol. He reports that he does not use illicit drugs.  Allergies:  Allergies  Allergen Reactions  . Codeine Itching and Nausea And Vomiting     (Not in a hospital admission)  Results for orders placed during the hospital encounter of 03/21/14 (from the past 48 hour(s))  CBC WITH DIFFERENTIAL     Status: Abnormal   Collection Time    03/21/14  9:13 PM      Result Value Ref Range   WBC 16.5 (*) 4.0 - 10.5 K/uL   RBC 3.93 (*) 4.22 - 5.81 MIL/uL   Hemoglobin 12.4 (*) 13.0 - 17.0 g/dL   HCT 36.1 (*) 39.0 - 52.0 %   MCV 91.9  78.0 - 100.0 fL   MCH 31.6  26.0 - 34.0 pg   MCHC 34.3  30.0 - 36.0 g/dL   RDW 14.2  11.5 - 15.5 %   Platelets 215  150 - 400 K/uL   Neutrophils Relative % 59  43 - 77 %   Neutro Abs 9.7 (*) 1.7 - 7.7 K/uL   Lymphocytes Relative 30  12 - 46 %   Lymphs Abs 5.0 (*) 0.7 - 4.0 K/uL   Monocytes Relative 9  3 - 12 %   Monocytes Absolute 1.5 (*) 0.1 - 1.0 K/uL   Eosinophils Relative 2  0 - 5 %   Eosinophils Absolute 0.3  0.0 - 0.7 K/uL   Basophils Relative 0  0 - 1 %    Basophils Absolute 0.0  0.0 - 0.1 K/uL  BASIC METABOLIC PANEL     Status: Abnormal   Collection Time    03/21/14  9:13 PM      Result Value Ref Range   Sodium 137  137 - 147 mEq/L   Potassium 4.3  3.7 - 5.3 mEq/L   Chloride 102  96 - 112 mEq/L   CO2 24  19 - 32 mEq/L   Glucose, Bld 101 (*) 70 - 99 mg/dL   BUN 19  6 - 23 mg/dL   Creatinine, Ser 0.97  0.50 - 1.35 mg/dL   Calcium 8.9  8.4 - 10.5 mg/dL   GFR calc non  Af Amer >90  >90 mL/min   GFR calc Af Amer >90  >90 mL/min   Comment: (NOTE)     The eGFR has been calculated using the CKD EPI equation.     This calculation has not been validated in all clinical situations.     eGFR's persistently <90 mL/min signify possible Chronic Kidney     Disease.   Mr Lumbar Spine W Contrast  03/21/2014   CLINICAL DATA:  Progressive pain in lower back status post recent back surgery on 03/15/2014.  EXAM: MRI LUMBAR SPINE WITH CONTRAST  TECHNIQUE: Multiplanar and multiecho pulse sequences of the lumbar spine were obtained with intravenous contrast.  CONTRAST:  73m MULTIHANCE GADOBENATE DIMEGLUMINE 529 MG/ML IV SOLN  COMPARISON:  Prior study from 03/15/2014.  FINDINGS: For the purposes of this dictation, the lowest well-formed intervertebral disc spaces presumed to be the L5-S1 level, and there presumed to be 5 lumbar type vertebral bodies.  The vertebral bodies are normally aligned with preservation of the normal lumbar lordosis. Vertebral body heights are well maintained. Signal intensity within the vertebral body bone marrow is within normal limits. The conus medullaris terminates at the L1 level.  Postoperative changes from recent posterior spinal decompression with fusion present at L4 thru S1. Bilateral transpedicular screws present at L4, L5, and S1. Intervertebral disc spacers present at L4-5 and L5-S1. The pedicular screws appear well positioned without encroachment on to the spinal canal. A T1 hypo intense, T2 hyperintense nonenhancing collection with  internal components measuring 4.9 (craniocaudad) x 1.8 (transverse) x 1.8 (AP) cm is seen within the epidural space at the dorsal aspect of the thecal sac (series 6, image 12). The superior aspect of the collection begins at the level of the mid L4 level. The inferior aspect of the collection terminates at the level of the L5-S1 intervertebral disc space. There is secondary severe compression of the nerve roots of the cauda equina at the level of the L4-5 intervertebral disc space as well as the L5 vertebral body. This is best appreciated on sagittal T2 weighted sequence (series 3, image 8).  At L1-2, there is minimal degenerative disc bulge without focal disc protrusion. No significant facet arthrosis. No canal or neural foraminal stenosis.  At L2-3, no disc bulge or disc protrusion. The intervertebral disc is well hydrated. No significant facet arthrosis. No canal or neural foraminal stenosis.  At L3-4, no disc bulge or disc protrusion. Mild bilateral facet hypertrophy present with resultant mild bilateral foraminal stenosis. No significant canal stenosis.  IMPRESSION: 1. 4.9 x 1.8 x 1.8 cm collection within the dorsal epidural space at the L4-5 levels as above, highly concerning for epidural abscess. There is secondary severe compression upon the nerve roots of the cauda equina which are displaced and compressed anteriorly. 2. Postoperative changes from recent posterior spinal decompression with fixation at the L4 thru S1 levels. 3. Mild multilevel degenerative changes as above.   Electronically Signed   By: BJeannine BogaM.D.   On: 03/21/2014 23:38    Review of Systems  Constitutional: Negative.   HENT: Negative.   Respiratory: Negative.   Cardiovascular: Negative.   Gastrointestinal: Negative.   Genitourinary: Negative.   Musculoskeletal: Positive for back pain.  Skin: Negative.   Neurological: Positive for sensory change and focal weakness.  Endo/Heme/Allergies: Negative.    Psychiatric/Behavioral: Negative.     Blood pressure 105/68, pulse 82, temperature 99.5 F (37.5 C), temperature source Rectal, resp. rate 22, height 6' 1"  (1.854 m), weight 92.987  kg (205 lb), SpO2 92.00%. Physical Exam hent,nkl. Neck,nl. Cv, nl lungs,c;ear, abdomen, soft, extremities, nl. NEURO  Weakness left foot 3/5 associated with pain to movement. Wound . No evidence of infection. Mri shows severe thecal sac compression secondary to fluid collection  Assessment/Plan Exploration of lumbar wound asap. Spoke with him and family  Floyce Stakes 03/22/2014, 12:35 AM

## 2014-03-22 NOTE — Progress Notes (Signed)
Patient ID: Keith Dawson, male   DOB: 04/16/1971, 43 y.o.   MRN: [id number] Waiting for the mri. Almost 40 minutes with the leg in flexion position and good tone. Spoke with wife

## 2014-03-22 NOTE — Anesthesia Postprocedure Evaluation (Signed)
Anesthesia Post Note  Patient: Keith Dawson  Procedure(s) Performed: Procedure(s) (LRB): irrigation and debriedment of lumbar wound (N/A)  Anesthesia type: General  Patient location: PACU  Post pain: Pain level controlled  Post assessment: Patient's Cardiovascular Status Stable  Last Vitals:  Filed Vitals:   03/22/14 0315  BP: 115/85  Pulse: 89  Temp:   Resp: 15    Post vital signs: Reviewed and stable  Level of consciousness: alert  Complications: No apparent anesthesia complications

## 2014-03-22 NOTE — Progress Notes (Signed)
Patient ID: Keith Dawson, male   DOB: 07/19/1971, 43 y.o.   MRN: [id number] Finally he is ready to go to the mri. They were busy with multiple ER patients. Patient just finish eating because he was hungry. Since i left at 1 pm he kept his left leg in a flexion position and i found him in the same way. He has multiple complains about his care with medications, drain, constipation since last week. Neurologically he is unchanged. The drain is working well. After the mri will move him to a different room close to the nurses station for better monitoring. See note from PT/OT from this morning when he was found out of bed. i did talk with the RN taken care of him and he saw moving his leg.  Review the case with wife and father . Dr Yetta BarreJones is coming tonite from a meeting and he will take over his case.

## 2014-03-23 MED ORDER — BISACODYL 5 MG PO TBEC
10.0000 mg | DELAYED_RELEASE_TABLET | Freq: Every day | ORAL | Status: DC | PRN
Start: 2014-03-23 — End: 2014-03-23

## 2014-03-23 MED ORDER — FLEET ENEMA 7-19 GM/118ML RE ENEM
1.0000 | ENEMA | Freq: Once | RECTAL | Status: AC
  Administered 2014-03-23: 03:00:00 via RECTAL
  Filled 2014-03-23: qty 1

## 2014-03-23 NOTE — Progress Notes (Signed)
Patient ID: Keith Dawson, male   DOB: 12/31/1970, 43 y.o.   MRN: [id number] Subjective: Patient reports improvement in his back and left leg pain with some residual numbness in left leg but no weakness. No bowel or bladder problems.  Objective: Vital signs in last 24 hours: Temp:  [97.5 F (36.4 C)-98.1 F (36.7 C)] 98.1 F (36.7 C) (04/17 1000) Pulse Rate:  [80-96] 96 (04/17 1000) Resp:  [18-20] 18 (04/17 1000) BP: (94-129)/(62-84) 122/77 mmHg (04/17 1000) SpO2:  [90 %-97 %] 97 % (04/17 1000)  Intake/Output from previous day: 04/16 0701 - 04/17 0700 In: 690 [P.O.:620] Out: -  Intake/Output this shift: Total I/O In: 410 [P.O.:360; Other:50] Out: -   Neurologic: Grossly normal, with good hip flexion, knee extension, dorsiflexion, and plantar flexion on the left.  Lab Results: Lab Results  Component Value Date   WBC 16.5* 03/21/2014   HGB 12.4* 03/21/2014   HCT 36.1* 03/21/2014   MCV 91.9 03/21/2014   PLT 215 03/21/2014   Lab Results  Component Value Date   INR 0.97 03/05/2014   BMET Lab Results  Component Value Date   NA 137 03/21/2014   K 4.3 03/21/2014   CL 102 03/21/2014   CO2 24 03/21/2014   GLUCOSE 101* 03/21/2014   BUN 19 03/21/2014   CREATININE 0.97 03/21/2014   CALCIUM 8.9 03/21/2014    Studies/Results: Mr Lumbar Spine Wo Contrast  03/22/2014   CLINICAL DATA:  Surgery this morning to relieve fluid collection at the L4 and L5 level. Drain placement.  EXAM: MRI LUMBAR SPINE WITHOUT CONTRAST  TECHNIQUE: Multiplanar, multisequence MR imaging was performed. No intravenous contrast was administered.  COMPARISON:  03/21/2014  FINDINGS: There has been previous diskectomy, decompression and fusion from L4 to the sacrum. Since the prior study, there has been surgery to drains/removed a large fluid collection dorsal to the thecal sac in the operative head which was causing compression of the thecal sac anteriorly. The thecal sac canal appears completely decompressed. A surgical  drain is present within the region previously occupied by the fluid collection. There is essentially no residual fluid demonstrable. No evidence of complication.  IMPRESSION: Drainage of a fluid collection posterior to the thecal sac at the L4-L5 levels. Drain in place. Complete evacuation of the fluid.   Electronically Signed   By: Paulina FusiMark  Shogry M.D.   On: 03/22/2014 20:01   Mr Lumbar Spine W Contrast  03/21/2014   CLINICAL DATA:  Progressive pain in lower back status post recent back surgery on 03/15/2014.  EXAM: MRI LUMBAR SPINE WITH CONTRAST  TECHNIQUE: Multiplanar and multiecho pulse sequences of the lumbar spine were obtained with intravenous contrast.  CONTRAST:  20mL MULTIHANCE GADOBENATE DIMEGLUMINE 529 MG/ML IV SOLN  COMPARISON:  Prior study from 03/15/2014.  FINDINGS: For the purposes of this dictation, the lowest well-formed intervertebral disc spaces presumed to be the L5-S1 level, and there presumed to be 5 lumbar type vertebral bodies.  The vertebral bodies are normally aligned with preservation of the normal lumbar lordosis. Vertebral body heights are well maintained. Signal intensity within the vertebral body bone marrow is within normal limits. The conus medullaris terminates at the L1 level.  Postoperative changes from recent posterior spinal decompression with fusion present at L4 thru S1. Bilateral transpedicular screws present at L4, L5, and S1. Intervertebral disc spacers present at L4-5 and L5-S1. The pedicular screws appear well positioned without encroachment on to the spinal canal. A T1 hypo intense, T2 hyperintense nonenhancing collection with internal  components measuring 4.9 (craniocaudad) x 1.8 (transverse) x 1.8 (AP) cm is seen within the epidural space at the dorsal aspect of the thecal sac (series 6, image 12). The superior aspect of the collection begins at the level of the mid L4 level. The inferior aspect of the collection terminates at the level of the L5-S1 intervertebral  disc space. There is secondary severe compression of the nerve roots of the cauda equina at the level of the L4-5 intervertebral disc space as well as the L5 vertebral body. This is best appreciated on sagittal T2 weighted sequence (series 3, image 8).  At L1-2, there is minimal degenerative disc bulge without focal disc protrusion. No significant facet arthrosis. No canal or neural foraminal stenosis.  At L2-3, no disc bulge or disc protrusion. The intervertebral disc is well hydrated. No significant facet arthrosis. No canal or neural foraminal stenosis.  At L3-4, no disc bulge or disc protrusion. Mild bilateral facet hypertrophy present with resultant mild bilateral foraminal stenosis. No significant canal stenosis.  IMPRESSION: 1. 4.9 x 1.8 x 1.8 cm collection within the dorsal epidural space at the L4-5 levels as above, highly concerning for epidural abscess. There is secondary severe compression upon the nerve roots of the cauda equina which are displaced and compressed anteriorly. 2. Postoperative changes from recent posterior spinal decompression with fixation at the L4 thru S1 levels. 3. Mild multilevel degenerative changes as above.   Electronically Signed   By: Rise MuBenjamin  McClintock M.D.   On: 03/21/2014 23:38    Assessment/Plan: Seems to be doing much better and seems to be fairly pleased with his progress. Physical and occupational therapy work with him today. Not much output from the drain which is to be expected based on his MRI showing no residual fluid collection. Strength is good. He wanted reassurance that I would remain his physician given the fact that another physician performed his emergent surgery. I reassured him that I will remain his neurosurgeon he will followup with me, and he was pleased with this.    LOS: 2 days    Tia AlertDavid S Maxfield Gildersleeve 03/23/2014, 11:32 AM

## 2014-03-23 NOTE — Progress Notes (Signed)
OT Cancellation Note  Patient Details Name: Keith Dawson MRN: [id number] DOB: 01/02/1971   Cancelled Treatment:    Reason Eval/Treat Not Completed: OT screened, no needs identified, will sign off. Pt ambulating in hallway with wife and nurse.  Discussed role of occupational therapy with pt and wife and reviewed back precautions. Pt reports he has no concerns with self care. He has a long handled sponge and reacher that he has been using for bathing/dressing, and wife is available to provide necessary assist.  Pt in agreement that OT can sign off.   03/23/2014 Grant RutsJenna E Jalaysia Lobb OTR/L Pager [phone] Office [phone]

## 2014-03-23 NOTE — Discharge Summary (Signed)
Physician Discharge Summary  Patient ID: Keith Dawson MRN: [id number] DOB/AGE: [birth year] 43 y.o.  Admit date: 03/21/2014 Discharge date: 03/23/2014  Admission Diagnoses: epidural hematoma    Discharge Diagnoses: same   Discharged Condition: good  Hospital Course: The patient was admitted on 03/21/2014 and taken to the operating room where the patient underwent evacuation of lumbar hematoma. The patient tolerated the procedure well and was taken to the recovery room and then to the floor in stable condition. He noted LE numbness the next day which prompted another lumbar MRI, which looked good. Symptoms then resolved spontaneously. The wound remained clean dry and intact. Pt had appropriate back soreness. No complaints of leg pain or new N/T/W. The patient remained afebrile with stable vital signs, and tolerated a regular diet. The patient continued to increase activities, and pain was well controlled with oral pain medications.  Did well with PT and was very eager for D/C home.  Consults: None  Significant Diagnostic Studies:  Results for orders placed during the hospital encounter of 03/21/14  WOUND CULTURE      Result Value Ref Range   Specimen Description WOUND BACK     Special Requests NONE     Gram Stain       Value: RARE WBC PRESENT,BOTH PMN AND MONONUCLEAR     NO ORGANISMS SEEN     Performed at [facility]   Culture       Value: NO GROWTH 1 DAY     Performed at [facility]   Report Status PENDING    AFB CULTURE WITH SMEAR      Result Value Ref Range   Specimen Description WOUND BACK     Special Requests NONE     ACID FAST SMEAR       Value: NO ACID FAST BACILLI SEEN     Performed at [facility]   Culture       Value: CULTURE WILL BE EXAMINED FOR 6 WEEKS BEFORE ISSUING A FINAL REPORT     Performed at [facility]   Report Status PENDING    GRAM STAIN      Result Value Ref Range   Specimen Description WOUND BACK     Special  Requests NONE     Gram Stain       Value: RARE WBC PRESENT,BOTH PMN AND MONONUCLEAR     NO ORGANISMS SEEN   Report Status 03/22/2014 FINAL    ANAEROBIC CULTURE      Result Value Ref Range   Specimen Description WOUND BACK     Special Requests NONE     Gram Stain       Value: RARE WBC PRESENT,BOTH PMN AND MONONUCLEAR     NO SQUAMOUS EPITHELIAL CELLS SEEN     NO ORGANISMS SEEN     Performed at [facility]   Culture       Value: NO ANAEROBES ISOLATED; CULTURE IN PROGRESS FOR 5 DAYS     Performed at [facility]   Report Status PENDING    CBC WITH DIFFERENTIAL      Result Value Ref Range   WBC 16.5 (*) 4.0 - 10.5 K/uL   RBC 3.93 (*) 4.22 - 5.81 MIL/uL   Hemoglobin 12.4 (*) 13.0 - 17.0 g/dL   HCT 40.9 (*) 81.1 - 91.4 %   MCV 91.9  78.0 - 100.0 fL   MCH 31.6  26.0 - 34.0 pg   MCHC 34.3  30.0 - 36.0  g/dL   RDW 16.1  09.6 - 04.5 %   Platelets 215  150 - 400 K/uL   Neutrophils Relative % 59  43 - 77 %   Neutro Abs 9.7 (*) 1.7 - 7.7 K/uL   Lymphocytes Relative 30  12 - 46 %   Lymphs Abs 5.0 (*) 0.7 - 4.0 K/uL   Monocytes Relative 9  3 - 12 %   Monocytes Absolute 1.5 (*) 0.1 - 1.0 K/uL   Eosinophils Relative 2  0 - 5 %   Eosinophils Absolute 0.3  0.0 - 0.7 K/uL   Basophils Relative 0  0 - 1 %   Basophils Absolute 0.0  0.0 - 0.1 K/uL  BASIC METABOLIC PANEL      Result Value Ref Range   Sodium 137  137 - 147 mEq/L   Potassium 4.3  3.7 - 5.3 mEq/L   Chloride 102  96 - 112 mEq/L   CO2 24  19 - 32 mEq/L   Glucose, Bld 101 (*) 70 - 99 mg/dL   BUN 19  6 - 23 mg/dL   Creatinine, Ser 4.09  0.50 - 1.35 mg/dL   Calcium 8.9  8.4 - 81.1 mg/dL   GFR calc non Af Amer >90  >90 mL/min   GFR calc Af Amer >90  >90 mL/min    Chest 2 View  03/05/2014   CLINICAL DATA:  Preoperative evaluation for lumbar surgery  EXAM: CHEST  2 VIEW  COMPARISON:  None.  FINDINGS: The heart size and mediastinal contours are within normal limits. Both lungs are clear. The visualized skeletal  structures are unremarkable.  IMPRESSION: No active cardiopulmonary disease.   Electronically Signed   By: Alcide Clever M.D.   On: 03/05/2014 11:16   Dg Lumbar Spine 2-3 Views  03/15/2014   CLINICAL DATA:  Lumbar spine fusion  EXAM: DG C-ARM 61-120 MIN; LUMBAR SPINE - 2-3 VIEW  : COMPARISON:  09/26/2013  FINDINGS: Pedicle screws and interconnecting rods diffuse L4 through S1. The orthopedic hardware is well-seated and aligned. Radiolucent disc spacers maintaining disc height at these levels. These are well positioned. No evidence of an operative complication.  IMPRESSION: Lumbar spine fusion from L4 through S1 as described. Please refer to the procedure report for a complete description.   Electronically Signed   By: Amie Portland M.D.   On: 03/15/2014 12:58   Mr Lumbar Spine Wo Contrast  03/22/2014   CLINICAL DATA:  Surgery this morning to relieve fluid collection at the L4 and L5 level. Drain placement.  EXAM: MRI LUMBAR SPINE WITHOUT CONTRAST  TECHNIQUE: Multiplanar, multisequence MR imaging was performed. No intravenous contrast was administered.  COMPARISON:  03/21/2014  FINDINGS: There has been previous diskectomy, decompression and fusion from L4 to the sacrum. Since the prior study, there has been surgery to drains/removed a large fluid collection dorsal to the thecal sac in the operative head which was causing compression of the thecal sac anteriorly. The thecal sac canal appears completely decompressed. A surgical drain is present within the region previously occupied by the fluid collection. There is essentially no residual fluid demonstrable. No evidence of complication.  IMPRESSION: Drainage of a fluid collection posterior to the thecal sac at the L4-L5 levels. Drain in place. Complete evacuation of the fluid.   Electronically Signed   By: Paulina Fusi M.D.   On: 03/22/2014 20:01   Mr Lumbar Spine W Contrast  03/21/2014   CLINICAL DATA:  Progressive pain in lower back status  post recent back  surgery on 03/15/2014.  EXAM: MRI LUMBAR SPINE WITH CONTRAST  TECHNIQUE: Multiplanar and multiecho pulse sequences of the lumbar spine were obtained with intravenous contrast.  CONTRAST:  20mL MULTIHANCE GADOBENATE DIMEGLUMINE 529 MG/ML IV SOLN  COMPARISON:  Prior study from 03/15/2014.  FINDINGS: For the purposes of this dictation, the lowest well-formed intervertebral disc spaces presumed to be the L5-S1 level, and there presumed to be 5 lumbar type vertebral bodies.  The vertebral bodies are normally aligned with preservation of the normal lumbar lordosis. Vertebral body heights are well maintained. Signal intensity within the vertebral body bone marrow is within normal limits. The conus medullaris terminates at the L1 level.  Postoperative changes from recent posterior spinal decompression with fusion present at L4 thru S1. Bilateral transpedicular screws present at L4, L5, and S1. Intervertebral disc spacers present at L4-5 and L5-S1. The pedicular screws appear well positioned without encroachment on to the spinal canal. A T1 hypo intense, T2 hyperintense nonenhancing collection with internal components measuring 4.9 (craniocaudad) x 1.8 (transverse) x 1.8 (AP) cm is seen within the epidural space at the dorsal aspect of the thecal sac (series 6, image 12). The superior aspect of the collection begins at the level of the mid L4 level. The inferior aspect of the collection terminates at the level of the L5-S1 intervertebral disc space. There is secondary severe compression of the nerve roots of the cauda equina at the level of the L4-5 intervertebral disc space as well as the L5 vertebral body. This is best appreciated on sagittal T2 weighted sequence (series 3, image 8).  At L1-2, there is minimal degenerative disc bulge without focal disc protrusion. No significant facet arthrosis. No canal or neural foraminal stenosis.  At L2-3, no disc bulge or disc protrusion. The intervertebral disc is well hydrated. No  significant facet arthrosis. No canal or neural foraminal stenosis.  At L3-4, no disc bulge or disc protrusion. Mild bilateral facet hypertrophy present with resultant mild bilateral foraminal stenosis. No significant canal stenosis.  IMPRESSION: 1. 4.9 x 1.8 x 1.8 cm collection within the dorsal epidural space at the L4-5 levels as above, highly concerning for epidural abscess. There is secondary severe compression upon the nerve roots of the cauda equina which are displaced and compressed anteriorly. 2. Postoperative changes from recent posterior spinal decompression with fixation at the L4 thru S1 levels. 3. Mild multilevel degenerative changes as above.   Electronically Signed   By: Rise MuBenjamin  McClintock M.D.   On: 03/21/2014 23:38   Dg C-arm 61-120 Min  03/15/2014   CLINICAL DATA:  Lumbar spine fusion  EXAM: DG C-ARM 61-120 MIN; LUMBAR SPINE - 2-3 VIEW  : COMPARISON:  09/26/2013  FINDINGS: Pedicle screws and interconnecting rods diffuse L4 through S1. The orthopedic hardware is well-seated and aligned. Radiolucent disc spacers maintaining disc height at these levels. These are well positioned. No evidence of an operative complication.  IMPRESSION: Lumbar spine fusion from L4 through S1 as described. Please refer to the procedure report for a complete description.   Electronically Signed   By: Amie Portlandavid  Ormond M.D.   On: 03/15/2014 12:58    Antibiotics:  Anti-infectives   Start     Dose/Rate Route Frequency Ordered Stop   03/22/14 0900  vancomycin (VANCOCIN) IVPB 1000 mg/200 mL premix     1,000 mg 200 mL/hr over 60 Minutes Intravenous Every 8 hours 03/22/14 0520     03/22/14 0600  ceFAZolin (ANCEF) IVPB 1 g/50 mL premix  1 g 100 mL/hr over 30 Minutes Intravenous Every 8 hours 03/22/14 0502 03/22/14 1445   03/22/14 0156  bacitracin 50,000 Units in sodium chloride irrigation 0.9 % 500 mL irrigation  Status:  Discontinued       As needed 03/22/14 0203 03/22/14 0233   03/22/14 0015  [MAR Hold]   gentamicin (GARAMYCIN) 600 mg in dextrose 5 % 100 mL IVPB     (On MAR Hold since 03/22/14 0136)   600 mg 115 mL/hr over 60 Minutes Intravenous  Once 03/22/14 0008 03/22/14 0200   03/22/14 0000  [MAR Hold]  vancomycin (VANCOCIN) IVPB 1000 mg/200 mL premix     (On MAR Hold since 03/22/14 0136)   1,000 mg 200 mL/hr over 60 Minutes Intravenous  Once 03/21/14 2358 03/22/14 0201   03/22/14 0000  cefTRIAXone (ROCEPHIN) 1 g in dextrose 5 % 50 mL IVPB     1 g 100 mL/hr over 30 Minutes Intravenous  Once 03/21/14 2358 03/22/14 0043      Discharge Exam: Blood pressure 105/67, pulse 77, temperature 98.3 F (36.8 C), temperature source Oral, resp. rate 20, height 6' (1.829 m), weight 99.791 kg (220 lb), SpO2 98.00%. Neurologic: Grossly normal Incision CDI  Discharge Medications:     Medication List         bisacodyl 5 MG EC tablet  Commonly known as:  DULCOLAX  Take 5 mg by mouth daily.     diazepam 5 MG tablet  Commonly known as:  VALIUM  Take 1-2 tablets (5-10 mg total) by mouth every 6 (six) hours as needed for muscle spasms.     meloxicam 7.5 MG tablet  Commonly known as:  MOBIC  Take 7.5 mg by mouth 2 (two) times daily.     morphine 15 MG 12 hr tablet  Commonly known as:  MS CONTIN  Take 1 tablet (15 mg total) by mouth every 12 (twelve) hours as needed for pain.     nortriptyline 50 MG capsule  Commonly known as:  PAMELOR  Take 50 mg by mouth at bedtime.     oxyCODONE-acetaminophen 5-325 MG per tablet  Commonly known as:  PERCOCET/ROXICET  Take 1-2 tablets by mouth every 4 (four) hours as needed for moderate pain.     polyethylene glycol packet  Commonly known as:  MIRALAX / GLYCOLAX  Take 17 g by mouth daily.        Disposition: home   Final Dx: lumbar post-op hematoma      Discharge Orders   Future Orders Complete By Expires   Call MD for:  difficulty breathing, headache or visual disturbances  As directed    Call MD for:  persistant nausea and vomiting  As  directed    Call MD for:  redness, tenderness, or signs of infection (pain, swelling, redness, odor or green/yellow discharge around incision site)  As directed    Call MD for:  severe uncontrolled pain  As directed    Call MD for:  temperature >100.4  As directed    Diet - low sodium heart healthy  As directed    Discharge instructions  As directed    Increase activity slowly  As directed          Signed: Tia Alertavid S Nikitta Sobiech 03/23/2014, 6:10 PM

## 2014-03-23 NOTE — Progress Notes (Signed)
Patient ID: Keith Dawson, male   DOB: 08/10/1971, 43 y.o.   MRN: [id number] Saw him today. Were aware by me last night about the mri being negative, today he is moving his leg and had a bm. smiling

## 2014-03-23 NOTE — Evaluation (Signed)
Physical Therapy Evaluation Patient Details Name: Keith Dawson MRN: [id number] DOB: 10/25/1971 Today's Date: 03/23/2014   History of Present Illness  HPI: patient who had lumbar fusion 1 week ago.lately he has noticed more weakness and pain in the left foot associated with numbness.  Patient now s/p I&D.  Clinical Impression  Patient demonstrates deficits in mobility as indicated below. Will need continued skilled PT to address deficits and maximize function. Recommend HHPT evaluation for safety with mobility in the home.    Follow Up Recommendations Home health PT;Supervision - Intermittent    Equipment Recommendations  Rolling walker with 5" wheels    Recommendations for Other Services       Precautions / Restrictions Precautions Precautions: Fall;Back Precaution Booklet Issued: Yes (comment) Precaution Comments: OT issued. PT reviewed with pt. Required Braces or Orthoses: Spinal Brace Spinal Brace: Lumbar corset;Applied in sitting position Restrictions Weight Bearing Restrictions: No      Mobility  Bed Mobility Overal bed mobility: Modified Independent             General bed mobility comments: Use of bed rails to transition to full sitting on EOB. No physical assist required, and pt maintained back precautions well.   Transfers Overall transfer level: Needs assistance Equipment used: Rolling walker (2 wheeled) Transfers: Sit to/from Stand Sit to Stand: Modified independent (Device/Increase time)         General transfer comment: increased time to perform, inital cues for hand placement good stability  Ambulation/Gait Ambulation/Gait assistance: PraxairMin Guard, Supervision Ambulation Distance (Feet): 340 Feet, multiple standing rest breaks Assistive device: Rolling walker (2 wheeled) Gait Pattern/deviations: Step-through pattern;Antalgic;Trunk flexed Gait velocity: Decreased Gait velocity interpretation: Below normal speed for age/gender General Gait Details:  VCs for upright posture and increased cadence  Stairs Stairs: Yes Stairs assistance: Min Assist  Stair Management: Two rails;Step to pattern Number of Stairs: 4 General stair comments: tolerated well, VCs for sequencing and technique  Wheelchair Mobility    Modified Rankin (Stroke Patients Only)       Balance                                             Pertinent Vitals/Pain 6/10    Home Living Family/patient expects to be discharged to:: Private residence Living Arrangements: Spouse/significant other;Children Available Help at Discharge: Family;Available 24 hours/day Type of Home: House Home Access: Stairs to enter Entrance Stairs-Rails: Doctor, general practiceight;Left Entrance Stairs-Number of Steps: 5 Home Layout: Two level;Able to live on main level with bedroom/bathroom Home Equipment: Bedside commode;Grab bars - tub/shower Additional Comments: pt's wife reports she can likely borrow a 3in1. He has a walking stick at home.     Prior Function Level of Independence: Independent               Hand Dominance   Dominant Hand: Right    Extremity/Trunk Assessment               Lower Extremity Assessment: LLE deficits/detail   LLE Deficits / Details: Patient states decreased sensation LLE, unable to tell if it is going to give out  Cervical / Trunk Assessment: Normal  Communication   Communication: No difficulties  Cognition Arousal/Alertness: Awake/alert Behavior During Therapy: WFL for tasks assessed/performed Overall Cognitive Status: Within Functional Limits for tasks assessed  General Comments General comments (skin integrity, edema, etc.): extensive education on home management, strategies for pain management and mobility, reinforcement of precautions, mobility expectations     Exercises        Assessment/Plan    PT Assessment Patent does not need any further PT services  PT Diagnosis     PT Problem List     PT Treatment Interventions     PT Goals (Current goals can be found in the Care Plan section) Acute Rehab PT Goals Patient Stated Goal: to increase independence PT Goal Formulation: With patient/family Time For Goal Achievement: 03/23/14 Potential to Achieve Goals: Good    Frequency     Barriers to discharge        Co-evaluation               End of Session Equipment Utilized During Treatment: Back brace Activity Tolerance: Patient tolerated treatment well Patient left: Other (comment) (Sitting EOB with wife present in room) Nurse Communication: Mobility status         Time: [id number] PT Time Calculation (min): 34 min   Charges:   PT Evaluation $Initial PT Evaluation Tier I: 1 Procedure PT Treatments $Gait Training: 8-22 mins $Self Care/Home Management: 8-22   PT G Codes:          Fabio AsaDevon J Dennard Vezina 03/23/2014, 3:30 PM Charlotte Crumbevon Isidro Monks, PT DPT  [phone]

## 2014-03-24 LAB — WOUND CULTURE: Culture: NO GROWTH

## 2014-03-27 ENCOUNTER — Inpatient Hospital Stay (HOSPITAL_COMMUNITY)
Admission: EM | Admit: 2014-03-27 | Discharge: 2014-03-28 | DRG: 694 | Disposition: A | Discharge status: Home / Self Care | Payer: Medicare Other | Attending: Internal Medicine | Admitting: Internal Medicine

## 2014-03-27 ENCOUNTER — Emergency Department (HOSPITAL_COMMUNITY): Payer: Medicare Other

## 2014-03-27 ENCOUNTER — Encounter (HOSPITAL_COMMUNITY): Payer: Self-pay | Admitting: Neurosurgery

## 2014-03-27 DIAGNOSIS — N179 Acute kidney failure, unspecified: Secondary | ICD-10-CM | POA: Diagnosis present

## 2014-03-27 DIAGNOSIS — Z79899 Other long term (current) drug therapy: Secondary | ICD-10-CM

## 2014-03-27 DIAGNOSIS — N201 Calculus of ureter: Secondary | ICD-10-CM | POA: Diagnosis not present

## 2014-03-27 DIAGNOSIS — F17200 Nicotine dependence, unspecified, uncomplicated: Secondary | ICD-10-CM | POA: Diagnosis present

## 2014-03-27 DIAGNOSIS — D72829 Elevated white blood cell count, unspecified: Secondary | ICD-10-CM | POA: Diagnosis not present

## 2014-03-27 DIAGNOSIS — N200 Calculus of kidney: Secondary | ICD-10-CM | POA: Diagnosis present

## 2014-03-27 DIAGNOSIS — M545 Low back pain, unspecified: Secondary | ICD-10-CM | POA: Diagnosis not present

## 2014-03-27 DIAGNOSIS — Z981 Arthrodesis status: Secondary | ICD-10-CM

## 2014-03-27 DIAGNOSIS — R109 Unspecified abdominal pain: Secondary | ICD-10-CM | POA: Diagnosis not present

## 2014-03-27 DIAGNOSIS — S064X9A Epidural hemorrhage with loss of consciousness of unspecified duration, initial encounter: Secondary | ICD-10-CM

## 2014-03-27 DIAGNOSIS — R112 Nausea with vomiting, unspecified: Secondary | ICD-10-CM | POA: Diagnosis not present

## 2014-03-27 DIAGNOSIS — S064XAA Epidural hemorrhage with loss of consciousness status unknown, initial encounter: Secondary | ICD-10-CM

## 2014-03-27 DIAGNOSIS — N289 Disorder of kidney and ureter, unspecified: Secondary | ICD-10-CM

## 2014-03-27 DIAGNOSIS — M5459 Other low back pain: Secondary | ICD-10-CM

## 2014-03-27 DIAGNOSIS — M5126 Other intervertebral disc displacement, lumbar region: Secondary | ICD-10-CM

## 2014-03-27 DIAGNOSIS — R944 Abnormal results of kidney function studies: Secondary | ICD-10-CM | POA: Diagnosis not present

## 2014-03-27 LAB — COMPREHENSIVE METABOLIC PANEL
ALT: 30 U/L (ref 0–53)
AST: 17 U/L (ref 0–37)
Albumin: 3.4 g/dL — ABNORMAL LOW (ref 3.5–5.2)
Alkaline Phosphatase: 118 U/L — ABNORMAL HIGH (ref 39–117)
BILIRUBIN TOTAL: 0.2 mg/dL — AB (ref 0.3–1.2)
BUN: 21 mg/dL (ref 6–23)
CALCIUM: 10 mg/dL (ref 8.4–10.5)
CO2: 26 mEq/L (ref 19–32)
CREATININE: 1.46 mg/dL — AB (ref 0.50–1.35)
Chloride: 101 mEq/L (ref 96–112)
GFR calc Af Amer: 67 mL/min — ABNORMAL LOW (ref >90)
GFR, EST NON AFRICAN AMERICAN: 58 mL/min — AB (ref >90)
Glucose, Bld: 132 mg/dL — ABNORMAL HIGH (ref 70–99)
Potassium: 4.6 mEq/L (ref 3.7–5.3)
Sodium: 138 mEq/L (ref 137–147)
Total Protein: 7.1 g/dL (ref 6.0–8.3)

## 2014-03-27 LAB — CBC
HEMATOCRIT: 40.2 % (ref 39.0–52.0)
Hemoglobin: 13.8 g/dL (ref 13.0–17.0)
MCH: 31.9 pg (ref 26.0–34.0)
MCHC: 34.3 g/dL (ref 30.0–36.0)
MCV: 93.1 fL (ref 78.0–100.0)
Platelets: 336 10*3/uL (ref 150–400)
RBC: 4.32 MIL/uL (ref 4.22–5.81)
RDW: 14 % (ref 11.5–15.5)
WBC: 26.7 10*3/uL — AB (ref 4.0–10.5)

## 2014-03-27 LAB — URINALYSIS, ROUTINE W REFLEX MICROSCOPIC
Bilirubin Urine: NEGATIVE
Glucose, UA: NEGATIVE mg/dL
Ketones, ur: NEGATIVE mg/dL
LEUKOCYTES UA: NEGATIVE
Nitrite: NEGATIVE
Protein, ur: NEGATIVE mg/dL
SPECIFIC GRAVITY, URINE: 1.026 (ref 1.005–1.030)
UROBILINOGEN UA: 0.2 mg/dL (ref 0.0–1.0)
pH: 6 (ref 5.0–8.0)

## 2014-03-27 LAB — URINE MICROSCOPIC-ADD ON

## 2014-03-27 LAB — ANAEROBIC CULTURE

## 2014-03-27 MED ORDER — HYDROMORPHONE HCL PF 1 MG/ML IJ SOLN
1.0000 mg | Freq: Once | INTRAMUSCULAR | Status: AC
  Administered 2014-03-27: 1 mg via INTRAVENOUS
  Filled 2014-03-27: qty 1

## 2014-03-27 MED ORDER — IOHEXOL 300 MG/ML  SOLN: 100.0000 mL | Freq: Once | INTRAMUSCULAR | Status: AC | PRN

## 2014-03-27 MED ORDER — SODIUM CHLORIDE 0.9 % IV BOLUS (SEPSIS)
1000.0000 mL | Freq: Once | INTRAVENOUS | Status: AC
  Administered 2014-03-27: 1000 mL via INTRAVENOUS

## 2014-03-27 MED ORDER — ONDANSETRON HCL 4 MG/2ML IJ SOLN
4.0000 mg | Freq: Once | INTRAMUSCULAR | Status: AC
  Administered 2014-03-27: 4 mg via INTRAVENOUS
  Filled 2014-03-27: qty 2

## 2014-03-27 NOTE — ED Notes (Signed)
Pt. reports progressing pain at lower back , unable to take prescription medications due to nausea and vomitting today , s/p low back surgery 03/15/2014 by Dr. Yetta BarreJones.

## 2014-03-27 NOTE — ED Provider Notes (Signed)
CSN: [id number]     Arrival date & time 03/27/14  2041 History   First MD Initiated Contact with Patient 03/27/14 2049     Chief Complaint  Patient presents with  . Back Pain     (Consider location/radiation/quality/duration/timing/severity/associated sxs/prior Treatment) The history is provided by the patient and the spouse.   history of present illness: 43 year old male with history of lumbar fusion complicated by hematoma at the site requiring laminectomy one week ago who presents with chief complaint of nausea, vomiting, and back pain. Onset of symptoms was this morning. Patient states that he became nauseous this morning he has had a few episodes of nonbloody nonbilious emesis today. As a result of the nausea he has been unable to take any of his pain medication. As the day has gone on the pain in his back has become worse to the point now it is "unbearable". Pain has been constant, throbbing, rated severe, nonradiating, exacerbated by movement. He denies any new paresthesias, numbness, weakness in the lower extremities, fever, bowel or bladder dysfunction.  Past Medical History  Diagnosis Date  . Headache(784.0)   . Arthritis     back   Past Surgical History  Procedure Laterality Date  . Knee surgery Left   . Maximum access (mas)posterior lumbar interbody fusion (plif) 2 level N/A 03/15/2014    Procedure: FOR MAXIMUM ACCESS (MAS) POSTERIOR LUMBAR INTERBODY FUSION (PLIF) 2 LEVEL four/five five/sacral one;  Surgeon: Tia Alert, MD;  Location: [facility];  Service: Neurosurgery;  Laterality: N/A;  . Laminectomy N/A 03/22/2014    Procedure: irrigation and debriedment of lumbar wound;  Surgeon: Karn Cassis, MD;  Location: [facility];  Service: Neurosurgery;  Laterality: N/A;   No family history on file. History  Substance Use Topics  . Smoking status: Current Every Day Smoker -- 1.00 packs/day for 26 years  . Smokeless tobacco: Never Used  . Alcohol Use: Yes     Comment:  social    Review of Systems  Constitutional: Negative for fever and chills.  HENT: Negative.   Eyes: Negative.   Respiratory: Negative for cough and shortness of breath.   Cardiovascular: Negative for chest pain and palpitations.  Gastrointestinal: Positive for nausea and vomiting. Negative for abdominal pain, diarrhea, constipation and blood in stool.  Genitourinary: Negative.   Musculoskeletal: Positive for back pain.  Skin: Negative.   Neurological: Negative.  Negative for weakness and numbness.  All other systems reviewed and are negative.     Allergies  Codeine  Home Medications   Prior to Admission medications   Medication Sig Start Date End Date Taking? Authorizing Provider  bisacodyl (DULCOLAX) 5 MG EC tablet Take 5 mg by mouth daily.    Historical Provider, MD  diazepam (VALIUM) 5 MG tablet Take 1-2 tablets (5-10 mg total) by mouth every 6 (six) hours as needed for muscle spasms. 03/17/14   Temple Pacini, MD  meloxicam (MOBIC) 7.5 MG tablet Take 7.5 mg by mouth 2 (two) times daily.    Historical Provider, MD  morphine (MS CONTIN) 15 MG 12 hr tablet Take 1 tablet (15 mg total) by mouth every 12 (twelve) hours as needed for pain. 03/17/14   Temple Pacini, MD  nortriptyline (PAMELOR) 50 MG capsule Take 50 mg by mouth at bedtime.    Historical Provider, MD  oxyCODONE-acetaminophen (PERCOCET/ROXICET) 5-325 MG per tablet Take 1-2 tablets by mouth every 4 (four) hours as needed for moderate pain. 03/17/14   Kathaleen Maser  Pool, MD  polyethylene glycol (MIRALAX / GLYCOLAX) packet Take 17 g by mouth daily.    Historical Provider, MD   BP 134/89  Pulse 100  Temp(Src) 98.9 F (37.2 C) (Oral)  Resp 18  SpO2 92% Physical Exam  Nursing note and vitals reviewed. Constitutional: He is oriented to person, place, and time. He appears well-developed and well-nourished. No distress.  HENT:  Head: Normocephalic and atraumatic.  Eyes: Conjunctivae are normal.  Neck: Neck supple.    Cardiovascular: Normal rate, regular rhythm, normal heart sounds and intact distal pulses.   Pulmonary/Chest: Effort normal and breath sounds normal. He has no wheezes. He has no rales.  Abdominal: Soft. He exhibits no distension. There is no tenderness.  Musculoskeletal: Normal range of motion.       Lumbar back: He exhibits tenderness. He exhibits no bony tenderness, no swelling and no deformity.       Back:  Neurological: He is alert and oriented to person, place, and time. He has normal strength. A sensory deficit (L4/L5 distribution on the left leg that patient reports is chronic and preceeds his surgeries.  Sensation otherwise intact to light touch throughout.) is present. No cranial nerve deficit.  Skin: Skin is warm and dry.    ED Course  Procedures (including critical care time) Labs Review Labs Reviewed  CBC - Abnormal; Notable for the following:    WBC 26.7 (*)    All other components within normal limits  COMPREHENSIVE METABOLIC PANEL - Abnormal; Notable for the following:    Glucose, Bld 132 (*)    Creatinine, Ser 1.46 (*)    Albumin 3.4 (*)    Alkaline Phosphatase 118 (*)    Total Bilirubin 0.2 (*)    GFR calc non Af Amer 58 (*)    GFR calc Af Amer 67 (*)    All other components within normal limits  URINALYSIS, ROUTINE W REFLEX MICROSCOPIC - Abnormal; Notable for the following:    Hgb urine dipstick MODERATE (*)    All other components within normal limits  BASIC METABOLIC PANEL - Abnormal; Notable for the following:    Glucose, Bld 107 (*)    GFR calc non Af Amer 76 (*)    GFR calc Af Amer 88 (*)    All other components within normal limits  CBC - Abnormal; Notable for the following:    WBC 14.9 (*)    RBC 4.11 (*)    Hemoglobin 12.8 (*)    HCT 38.2 (*)    All other components within normal limits  URINE MICROSCOPIC-ADD ON    Imaging Review Ct Abdomen Pelvis W Contrast  03/28/2014   CLINICAL DATA:  Nausea and vomit  EXAM: CT ABDOMEN AND PELVIS WITH  CONTRAST  TECHNIQUE: Multidetector CT imaging of the abdomen and pelvis was performed using the standard protocol following bolus administration of intravenous contrast.  CONTRAST:  100mL OMNIPAQUE IOHEXOL 300 MG/ML  SOLN  COMPARISON:  None.  FINDINGS: Bibasilar atelectasis.  Mild diffuse hepatic steatosis. The gallbladder, spleen, pancreas, adrenal glands are within normal limits.  Right hydronephrosis, delayed nephro graphic phase, and delayed urinary excretion her associated with a 6 mm right ureterovesical junction calculus.  Normal appendix.  Unremarkable prostate and bladder.  Postoperative changes in lumbar spine are present from L4 through S1 fusion.  IMPRESSION: 6 mm right ureterovesical junction calculus is associated with secondary findings of right ureteral obstruction   Electronically Signed   By: Maryclare BeanArt  Hoss M.D.   On: 03/28/2014  02:17   Dg Chest Portable 1 View  03/27/2014   CLINICAL DATA:  Low back pain with nausea and vomiting. Recent back surgery.  EXAM: PORTABLE CHEST - 1 VIEW  COMPARISON:  DG C-ARM 61-120 MIN dated 03/15/2014; DG CHEST 2 VIEW dated 03/05/2014  FINDINGS: Shallow inspiration. The heart size and mediastinal contours are within normal limits. Both lungs are clear. The visualized skeletal structures are unremarkable.  IMPRESSION: No active disease.   Electronically Signed   By: Burman NievesWilliam  Stevens M.D.   On: 03/27/2014 22:19     EKG Interpretation None      MDM   Final diagnoses:  Renal insufficiency  Right ureteral calculus  Leukocytosis    43 year old male with history of lumbar fusion complicated by epidural hematoma requiring laminectomy one week ago presents with nausea and emesis today preventing patient from taking his pain medication now with worsening back pain.   No abdominal pain or tenderness to palpation. No signs of surgical abdomen. Getting labs, giving IVF and anti-emetics.  Suspect back pain due to inability to tolerate oral pain medication today. No  new numbness, loss of sensation, weakness, bowel or bladder dysfunction. No fevers or signs of infection at the surgical site. Do not feel emergent imaging indicated at this time. Giving Dilaudid for pain.  Labs with leukocytosis of 26,000. Patient's pain improved but not completely resolved after initial Dilaudid and will give additional dose. With improved pain patient now localizing pain to the right flank. Concern for possible renal stone or pyelonephritis.  Will get UA and CT of the abdomen and pelvis. Nausea improved.  UA with moderate blood.  Pt care transferred to Dr. Preston FleetingGlick with CT pending.  Cherre RobinsBryan Obie Kallenbach, MD 03/28/14 1310

## 2014-03-27 NOTE — ED Notes (Signed)
Pt reports n/v today, "too many times to count" - denies diarrhea or fever - pt recently underwent back surgery for spinal fusion and then again last week to drain a swollen area to the surgical site - pt in acute distress d/t nausea and pain - pt states he has not been able to take his prescribed medications for pain d/t n/v. Pt tearful on assessment.

## 2014-03-28 ENCOUNTER — Emergency Department (HOSPITAL_COMMUNITY): Payer: Medicare Other

## 2014-03-28 ENCOUNTER — Encounter (HOSPITAL_COMMUNITY): Payer: Self-pay | Admitting: Radiology

## 2014-03-28 DIAGNOSIS — D72829 Elevated white blood cell count, unspecified: Secondary | ICD-10-CM | POA: Diagnosis not present

## 2014-03-28 DIAGNOSIS — N289 Disorder of kidney and ureter, unspecified: Secondary | ICD-10-CM

## 2014-03-28 DIAGNOSIS — R944 Abnormal results of kidney function studies: Secondary | ICD-10-CM | POA: Diagnosis not present

## 2014-03-28 DIAGNOSIS — M545 Low back pain, unspecified: Secondary | ICD-10-CM

## 2014-03-28 DIAGNOSIS — R109 Unspecified abdominal pain: Secondary | ICD-10-CM | POA: Diagnosis not present

## 2014-03-28 DIAGNOSIS — N201 Calculus of ureter: Secondary | ICD-10-CM | POA: Diagnosis not present

## 2014-03-28 DIAGNOSIS — N200 Calculus of kidney: Secondary | ICD-10-CM | POA: Diagnosis present

## 2014-03-28 DIAGNOSIS — Z981 Arthrodesis status: Secondary | ICD-10-CM

## 2014-03-28 DIAGNOSIS — N179 Acute kidney failure, unspecified: Secondary | ICD-10-CM | POA: Diagnosis present

## 2014-03-28 DIAGNOSIS — M5459 Other low back pain: Secondary | ICD-10-CM | POA: Diagnosis present

## 2014-03-28 LAB — CBC
HCT: 38.2 % — ABNORMAL LOW (ref 39.0–52.0)
Hemoglobin: 12.8 g/dL — ABNORMAL LOW (ref 13.0–17.0)
MCH: 31.1 pg (ref 26.0–34.0)
MCHC: 33.5 g/dL (ref 30.0–36.0)
MCV: 92.9 fL (ref 78.0–100.0)
Platelets: 289 10*3/uL (ref 150–400)
RBC: 4.11 MIL/uL — ABNORMAL LOW (ref 4.22–5.81)
RDW: 13.9 % (ref 11.5–15.5)
WBC: 14.9 10*3/uL — AB (ref 4.0–10.5)

## 2014-03-28 LAB — BASIC METABOLIC PANEL
BUN: 18 mg/dL (ref 6–23)
CO2: 22 mEq/L (ref 19–32)
CREATININE: 1.16 mg/dL (ref 0.50–1.35)
Calcium: 9 mg/dL (ref 8.4–10.5)
Chloride: 103 mEq/L (ref 96–112)
GFR, EST AFRICAN AMERICAN: 88 mL/min — AB (ref >90)
GFR, EST NON AFRICAN AMERICAN: 76 mL/min — AB (ref >90)
Glucose, Bld: 107 mg/dL — ABNORMAL HIGH (ref 70–99)
Potassium: 4.2 mEq/L (ref 3.7–5.3)
Sodium: 139 mEq/L (ref 137–147)

## 2014-03-28 MED ORDER — ONDANSETRON HCL 4 MG PO TABS: 4.0000 mg | ORAL_TABLET | Freq: Four times a day (QID) | ORAL | Status: DC | PRN

## 2014-03-28 MED ORDER — NORTRIPTYLINE HCL 25 MG PO CAPS
50.0000 mg | ORAL_CAPSULE | Freq: Every day | ORAL | Status: DC
  Filled 2014-03-28: qty 2

## 2014-03-28 MED ORDER — TAMSULOSIN HCL 0.4 MG PO CAPS: 0.4000 mg | ORAL_CAPSULE | Freq: Every day | ORAL | Status: DC

## 2014-03-28 MED ORDER — ACETAMINOPHEN 325 MG PO TABS: 650.0000 mg | ORAL_TABLET | Freq: Four times a day (QID) | ORAL | Status: DC | PRN

## 2014-03-28 MED ORDER — ACETAMINOPHEN 650 MG RE SUPP: 650.0000 mg | Freq: Four times a day (QID) | RECTAL | Status: DC | PRN

## 2014-03-28 MED ORDER — HYDROMORPHONE HCL PF 1 MG/ML IJ SOLN
1.0000 mg | Freq: Once | INTRAMUSCULAR | Status: AC
  Administered 2014-03-28: 1 mg via INTRAVENOUS
  Filled 2014-03-28: qty 1

## 2014-03-28 MED ORDER — ONDANSETRON HCL 4 MG/2ML IJ SOLN: 4.0000 mg | Freq: Four times a day (QID) | INTRAMUSCULAR | Status: DC | PRN

## 2014-03-28 MED ORDER — OXYCODONE-ACETAMINOPHEN 5-325 MG PO TABS: 1.0000 | ORAL_TABLET | ORAL | Status: DC | PRN

## 2014-03-28 MED ORDER — DIAZEPAM 5 MG PO TABS: 5.0000 mg | ORAL_TABLET | Freq: Four times a day (QID) | ORAL | Status: DC | PRN

## 2014-03-28 MED ORDER — IOHEXOL 300 MG/ML  SOLN
100.0000 mL | Freq: Once | INTRAMUSCULAR | Status: AC | PRN
  Administered 2014-03-28: 100 mL via INTRAVENOUS

## 2014-03-28 MED ORDER — TAMSULOSIN HCL 0.4 MG PO CAPS
0.4000 mg | ORAL_CAPSULE | Freq: Every day | ORAL | Status: DC
  Administered 2014-03-28: 0.4 mg via ORAL
  Filled 2014-03-28: qty 1

## 2014-03-28 MED ORDER — HYDROMORPHONE HCL PF 1 MG/ML IJ SOLN
0.5000 mg | INTRAMUSCULAR | Status: DC | PRN
  Administered 2014-03-28 (×3): 1 mg via INTRAVENOUS
  Filled 2014-03-28 (×3): qty 1

## 2014-03-28 MED ORDER — PROMETHAZINE HCL 12.5 MG PO TABS: 12.5000 mg | ORAL_TABLET | Freq: Four times a day (QID) | ORAL | Status: DC | PRN

## 2014-03-28 MED ORDER — ALUM & MAG HYDROXIDE-SIMETH 200-200-20 MG/5ML PO SUSP: 30.0000 mL | Freq: Four times a day (QID) | ORAL | Status: DC | PRN

## 2014-03-28 MED ORDER — ONDANSETRON HCL 4 MG/2ML IJ SOLN
4.0000 mg | Freq: Once | INTRAMUSCULAR | Status: AC
  Administered 2014-03-28: 4 mg via INTRAVENOUS
  Filled 2014-03-28: qty 2

## 2014-03-28 MED ORDER — SODIUM CHLORIDE 0.9 % IV SOLN
INTRAVENOUS | Status: DC
  Administered 2014-03-28 (×2): via INTRAVENOUS

## 2014-03-28 MED ORDER — MORPHINE SULFATE ER 15 MG PO TBCR
15.0000 mg | EXTENDED_RELEASE_TABLET | Freq: Two times a day (BID) | ORAL | Status: DC | PRN
  Administered 2014-03-28: 15 mg via ORAL
  Filled 2014-03-28: qty 1

## 2014-03-28 MED ORDER — ENOXAPARIN SODIUM 40 MG/0.4ML ~~LOC~~ SOLN: 40.0000 mg | Freq: Every day | SUBCUTANEOUS | Status: DC

## 2014-03-28 NOTE — Care Management Note (Unsigned)
    Page 1 of 1   03/28/2014     10:59:39 AM CARE MANAGEMENT NOTE 03/28/2014  Patient:  Keith Dawson,Keith Dawson   Account Number:  [id number]  Date Initiated:  03/23/2014  Documentation initiated by:  AMERSON,JULIE  Subjective/Objective Assessment:   Received after hours  call from [facility]; pt needs a RW for home.     Action/Plan:   Nurse instructed to fax order for RW, face sheet and H&P to [facility] at [phone].  RW will be delivered to pt at home, as no [facility] rep in house at this time.  Nurse verb understanding.   Anticipated DC Date:  03/23/2014   Anticipated DC Plan:  HOME/SELF CARE      DC Planning Services  CM consult      Choice offered to / List presented to:     DME arranged  [facility]      DME agency  [facility]        Status of service:  Completed, signed off Medicare Important Message given?   (If response is "NO", the following Medicare IM given date fields will be blank) Date Medicare IM given:   Date Additional Medicare IM given:    Discharge Disposition:  HOME/SELF CARE  Per UR Regulation:    If discussed at Long Length of Stay Meetings, dates discussed:    Comments:

## 2014-03-28 NOTE — H&P (Signed)
[facility] History and Physical  Xue Low ZOX:[id number] DOB: 1971/01/18 DOA: 03/27/2014  Referring physician:  EDP PCP: Gweneth Dimitri, MD  Specialists:   Chief Complaint:  Increased Low Back Pain  HPI: Keith Dawson is a 43 y.o. male with a recent Lumbar Laminectomy and Fusion then subsequent Hematoma evacuation and I+D who presents to the ED with complaints of intractable lower back pain since noon and Nausea and Vomiting X 1.  He rates the pain at a 10/10.  He denies any dysuria or cough.   He reports having chills.    In the ED, he was evaluated and a CT scan was performed and revealed a right ureteral stone,he was also found to have a leukocytosis and elevation in his BUN/Cr.      Review of Systems:  Constitutional: No Weight Loss, No Weight Gain, Night Sweats, Fevers, Chills, Fatigue, or Generalized Weakness HEENT: No Headaches, Difficulty Swallowing,Tooth/Dental Problems,Sore Throat,  No Sneezing, Rhinitis, Ear Ache, Nasal Congestion, or Post Nasal Drip,  Cardio-vascular:  No Chest pain, Orthopnea, PND, Edema in lower extremities, Anasarca, Dizziness, Palpitations  Resp: No Dyspnea, No DOE, No Cough, No Hemoptysis,  No Wheezing.    GI: No Heartburn, Indigestion, Abdominal Pain, +Nausea, +Vomiting, Diarrhea, Change in Bowel Habits,  Loss of Appetite  GU: No Dysuria, Change in Color of Urine, No Urgency or Frequency.  No flank pain.  Musculoskeletal: No Joint Pain or Swelling.  +Back Pain.  Neurologic: No Syncope, No Seizures, Muscle Weakness, +Paresthesia Left Lateral Foot, Vision Disturbance or Loss, No Diplopia, No Vertigo, No Difficulty Walking,  Skin: No Rash or Lesions. Psych: No Change in Mood or Affect. No Depression or Anxiety. No Memory loss. No Confusion or Hallucinations   Past Medical History  Diagnosis Date  . Headache(784.0)   . Arthritis     back        Previous Nephrolithiasis    Past Surgical History  Procedure Laterality Date  . Knee surgery  Left   . Maximum access (mas)posterior lumbar interbody fusion (plif) 2 level N/A 03/15/2014    Procedure: FOR MAXIMUM ACCESS (MAS) POSTERIOR LUMBAR INTERBODY FUSION (PLIF) 2 LEVEL four/five five/sacral one;  Surgeon: Tia Alert, MD;  Location: [facility];  Service: Neurosurgery;  Laterality: N/A;  . Laminectomy N/A 03/22/2014    Procedure: irrigation and debriedment of lumbar wound;  Surgeon: Karn Cassis, MD;  Location: [facility];  Service: Neurosurgery;  Laterality: N/A;       Prior to Admission medications   Medication Sig Start Date End Date Taking? Authorizing Provider  bisacodyl (DULCOLAX) 5 MG EC tablet Take 5 mg by mouth daily.   Yes Historical Provider, MD  diazepam (VALIUM) 5 MG tablet Take 1-2 tablets (5-10 mg total) by mouth every 6 (six) hours as needed for muscle spasms. 03/17/14  Yes Temple Pacini, MD  meloxicam (MOBIC) 7.5 MG tablet Take 7.5 mg by mouth 2 (two) times daily.   Yes Historical Provider, MD  morphine (MS CONTIN) 15 MG 12 hr tablet Take 1 tablet (15 mg total) by mouth every 12 (twelve) hours as needed for pain. 03/17/14  Yes Temple Pacini, MD  nortriptyline (PAMELOR) 50 MG capsule Take 50 mg by mouth at bedtime.   Yes Historical Provider, MD  oxyCODONE-acetaminophen (PERCOCET/ROXICET) 5-325 MG per tablet Take 1-2 tablets by mouth every 4 (four) hours as needed for moderate pain. 03/17/14  Yes Temple Pacini, MD  polyethylene glycol (MIRALAX / GLYCOLAX) packet Take 17 g by  mouth daily.   Yes Historical Provider, MD      Allergies  Allergen Reactions  . Codeine Itching and Nausea And Vomiting     Social History:  reports that he has been smoking.  He has never used smokeless tobacco. He reports that he drinks alcohol. He reports that he does not use illicit drugs.     No family history on file.     Physical Exam:  GEN:  Pleasant Well Nourished and Well Developed [age] y.o. Caucasian male  examined  and in no acute distress; cooperative with exam Filed  Vitals:   03/27/14 2045 03/28/14 0117 03/28/14 0430 03/28/14 0510  BP: 134/89 105/71 119/64 113/74  Pulse: 100 97 80 77  Temp: 98.9 F (37.2 C)   97.7 F (36.5 C)  TempSrc: Oral   Oral  Resp: 18   15  Weight:    92.352 kg (203 lb 9.6 oz)  SpO2: 92% 94% 94% 99%   Blood pressure 113/74, pulse 77, temperature 97.7 F (36.5 C), temperature source Oral, resp. rate 15, weight 92.352 kg (203 lb 9.6 oz), SpO2 99.00%. PSYCH: He is alert and oriented x4; does not appear anxious does not appear depressed; affect is normal HEENT: Normocephalic and Atraumatic, Mucous membranes pink; PERRLA; EOM intact; Fundi:  Benign;  No scleral icterus, Nares: Patent, Oropharynx: Clear, Fair Dentition, Neck:  FROM, no cervical lymphadenopathy nor thyromegaly or carotid bruit; no JVD; Breasts:: Not examined CHEST WALL: No tenderness CHEST: Normal respiration, clear to auscultation bilaterally HEART: Regular rate and rhythm; no murmurs rubs or gallops BACK: +Mid-Back  Surgical Incision, No wound Dehiscience,  No Signs of Infection,   No kyphosis or scoliosis; no CVA tenderness ABDOMEN: Positive Bowel Sounds, soft non-tender; no masses, no organomegaly. Rectal Exam: Not done EXTREMITIES: No cyanosis, clubbing or edema; no ulcerations. Genitalia: not examined PULSES: 2+ and symmetric SKIN: Normal hydration no rash or ulceration CNS:  Alert X Oriented x 4,  Numbness Left Lateral Foot (Pre-Surgical) No Focal Deficits Vascular: pulses palpable throughout    Labs on Admission:  Basic Metabolic Panel:  Recent Labs Lab 03/21/14 2113 03/27/14 2120  NA 137 138  K 4.3 4.6  CL 102 101  CO2 24 26  GLUCOSE 101* 132*  BUN 19 21  CREATININE 0.97 1.46*  CALCIUM 8.9 10.0   Liver Function Tests:  Recent Labs Lab 03/27/14 2120  AST 17  ALT 30  ALKPHOS 118*  BILITOT 0.2*  PROT 7.1  ALBUMIN 3.4*   No results found for this basename: LIPASE, AMYLASE,  in the last 168 hours No results found for this basename:  AMMONIA,  in the last 168 hours CBC:  Recent Labs Lab 03/21/14 2113 03/27/14 2120  WBC 16.5* 26.7*  NEUTROABS 9.7*  --   HGB 12.4* 13.8  HCT 36.1* 40.2  MCV 91.9 93.1  PLT 215 336   Cardiac Enzymes: No results found for this basename: CKTOTAL, CKMB, CKMBINDEX, TROPONINI,  in the last 168 hours  BNP (last 3 results) No results found for this basename: PROBNP,  in the last 8760 hours CBG: No results found for this basename: GLUCAP,  in the last 168 hours  Radiological Exams on Admission: Ct Abdomen Pelvis W Contrast  03/28/2014   CLINICAL DATA:  Nausea and vomit  EXAM: CT ABDOMEN AND PELVIS WITH CONTRAST  TECHNIQUE: Multidetector CT imaging of the abdomen and pelvis was performed using the standard protocol following bolus administration of intravenous contrast.  CONTRAST:  100mL OMNIPAQUE IOHEXOL  300 MG/ML  SOLN  COMPARISON:  None.  FINDINGS: Bibasilar atelectasis.  Mild diffuse hepatic steatosis. The gallbladder, spleen, pancreas, adrenal glands are within normal limits.  Right hydronephrosis, delayed nephro graphic phase, and delayed urinary excretion her associated with a 6 mm right ureterovesical junction calculus.  Normal appendix.  Unremarkable prostate and bladder.  Postoperative changes in lumbar spine are present from L4 through S1 fusion.  IMPRESSION: 6 mm right ureterovesical junction calculus is associated with secondary findings of right ureteral obstruction   Electronically Signed   By: Maryclare BeanArt  Hoss M.D.   On: 03/28/2014 02:17   Dg Chest Portable 1 View  03/27/2014   CLINICAL DATA:  Low back pain with nausea and vomiting. Recent back surgery.  EXAM: PORTABLE CHEST - 1 VIEW  COMPARISON:  DG C-ARM 61-120 MIN dated 03/15/2014; DG CHEST 2 VIEW dated 03/05/2014  FINDINGS: Shallow inspiration. The heart size and mediastinal contours are within normal limits. Both lungs are clear. The visualized skeletal structures are unremarkable.  IMPRESSION: No active disease.   Electronically Signed    By: Burman NievesWilliam  Stevens M.D.   On: 03/27/2014 22:19      Assessment/Plan:   43 y.o. male with  Principal Problem:   Nephrolithiasis Active Problems:   Right ureteral calculus   ARF (acute renal failure)   Intractable low back pain   S/P lumbar fusion   Leukocytosis, unspecified     1.   Right Ureteral Calculus-  Notify Urology in AM for Consult.  IVFs, Pain control  2.   ARF= IVFs , and monitor BUN/Cr.     3.   Intractable Back Pain- Pain Control PRN.     4.   S/P Lumbar Fusion and I+D of Hematoma-  Notify NeuroSurgery Dr Yetta BarreJones.    5.   Leukocytosis-   Send Blood cultures X 2,  Monitor Trend.   Afebrile, Wound is Without signs of Infection and UA -Negative,  so no ABxs for now  6.   DVT Prophylaxis with Lovenox.       Code Status:     FULL CODE  Family Communication:    Wife at Bedside Disposition Plan:  Inpatient       Time spent:  4960 Minutes  Morine Kohlman Velora HecklerC Avana Kreiser [facility] Pager [phone]  If 7PM-7AM, please contact night-coverage [email or web address] Password [email or web address] 03/28/2014, 5:17 AM

## 2014-03-28 NOTE — ED Provider Notes (Signed)
Patient initially seen and evaluated by Dr. Christain SacramentoBeaver and Dr. Lynelle DoctorKnapp. He is complaining of back pain and is status post lumbar fusion which was complicated by epidural hematoma. He is complaining of pain that is mainly in the midline and on exam, there is some lateralization to the right. Urinalysis has hematuria and CT of the abdomen and pelvis shows a distal right ureteral calculus. This is 6 mm and near the ureterovesical junction. He is noted to have an increase in his creatinine. Case is discussed with Dr. Patsi Searsannenbaum of urology service who feels that in patient with 2 functioning kidneys, obstructing calculus should not be the cause of her worsening renal function. Case is discussed with Dr. Lovell SheehanJenkins of [facility] who agrees to admit the patient.  Results for orders placed during the hospital encounter of 03/27/14  CBC      Result Value Ref Range   WBC 26.7 (*) 4.0 - 10.5 K/uL   RBC 4.32  4.22 - 5.81 MIL/uL   Hemoglobin 13.8  13.0 - 17.0 g/dL   HCT 86.540.2  78.439.0 - 69.652.0 %   MCV 93.1  78.0 - 100.0 fL   MCH 31.9  26.0 - 34.0 pg   MCHC 34.3  30.0 - 36.0 g/dL   RDW 29.514.0  28.411.5 - 13.215.5 %   Platelets 336  150 - 400 K/uL  COMPREHENSIVE METABOLIC PANEL      Result Value Ref Range   Sodium 138  137 - 147 mEq/L   Potassium 4.6  3.7 - 5.3 mEq/L   Chloride 101  96 - 112 mEq/L   CO2 26  19 - 32 mEq/L   Glucose, Bld 132 (*) 70 - 99 mg/dL   BUN 21  6 - 23 mg/dL   Creatinine, Ser 4.401.46 (*) 0.50 - 1.35 mg/dL   Calcium 10.210.0  8.4 - 72.510.5 mg/dL   Total Protein 7.1  6.0 - 8.3 g/dL   Albumin 3.4 (*) 3.5 - 5.2 g/dL   AST 17  0 - 37 U/L   ALT 30  0 - 53 U/L   Alkaline Phosphatase 118 (*) 39 - 117 U/L   Total Bilirubin 0.2 (*) 0.3 - 1.2 mg/dL   GFR calc non Af Amer 58 (*) >90 mL/min   GFR calc Af Amer 67 (*) >90 mL/min  URINALYSIS, ROUTINE W REFLEX MICROSCOPIC      Result Value Ref Range   Color, Urine YELLOW  YELLOW   APPearance CLEAR  CLEAR   Specific Gravity, Urine 1.026  1.005 - 1.030   pH 6.0  5.0 -  8.0   Glucose, UA NEGATIVE  NEGATIVE mg/dL   Hgb urine dipstick MODERATE (*) NEGATIVE   Bilirubin Urine NEGATIVE  NEGATIVE   Ketones, ur NEGATIVE  NEGATIVE mg/dL   Protein, ur NEGATIVE  NEGATIVE mg/dL   Urobilinogen, UA 0.2  0.0 - 1.0 mg/dL   Nitrite NEGATIVE  NEGATIVE   Leukocytes, UA NEGATIVE  NEGATIVE  URINE MICROSCOPIC-ADD ON      Result Value Ref Range   Squamous Epithelial / LPF RARE  RARE   RBC / HPF 3-6  <3 RBC/hpf   Bacteria, UA RARE  RARE   Chest 2 View  03/05/2014   CLINICAL DATA:  Preoperative evaluation for lumbar surgery  EXAM: CHEST  2 VIEW  COMPARISON:  None.  FINDINGS: The heart size and mediastinal contours are within normal limits. Both lungs are clear. The visualized skeletal structures are unremarkable.  IMPRESSION: No active cardiopulmonary disease.  Electronically Signed   By: Alcide Clever M.D.   On: 03/05/2014 11:16   Dg Lumbar Spine 2-3 Views  03/15/2014   CLINICAL DATA:  Lumbar spine fusion  EXAM: DG C-ARM 61-120 MIN; LUMBAR SPINE - 2-3 VIEW  : COMPARISON:  09/26/2013  FINDINGS: Pedicle screws and interconnecting rods diffuse L4 through S1. The orthopedic hardware is well-seated and aligned. Radiolucent disc spacers maintaining disc height at these levels. These are well positioned. No evidence of an operative complication.  IMPRESSION: Lumbar spine fusion from L4 through S1 as described. Please refer to the procedure report for a complete description.   Electronically Signed   By: Amie Portland M.D.   On: 03/15/2014 12:58   Mr Lumbar Spine Wo Contrast  03/22/2014   CLINICAL DATA:  Surgery this morning to relieve fluid collection at the L4 and L5 level. Drain placement.  EXAM: MRI LUMBAR SPINE WITHOUT CONTRAST  TECHNIQUE: Multiplanar, multisequence MR imaging was performed. No intravenous contrast was administered.  COMPARISON:  03/21/2014  FINDINGS: There has been previous diskectomy, decompression and fusion from L4 to the sacrum. Since the prior study, there has  been surgery to drains/removed a large fluid collection dorsal to the thecal sac in the operative head which was causing compression of the thecal sac anteriorly. The thecal sac canal appears completely decompressed. A surgical drain is present within the region previously occupied by the fluid collection. There is essentially no residual fluid demonstrable. No evidence of complication.  IMPRESSION: Drainage of a fluid collection posterior to the thecal sac at the L4-L5 levels. Drain in place. Complete evacuation of the fluid.   Electronically Signed   By: Paulina Fusi M.D.   On: 03/22/2014 20:01   Mr Lumbar Spine W Contrast  03/21/2014   CLINICAL DATA:  Progressive pain in lower back status post recent back surgery on 03/15/2014.  EXAM: MRI LUMBAR SPINE WITH CONTRAST  TECHNIQUE: Multiplanar and multiecho pulse sequences of the lumbar spine were obtained with intravenous contrast.  CONTRAST:  20mL MULTIHANCE GADOBENATE DIMEGLUMINE 529 MG/ML IV SOLN  COMPARISON:  Prior study from 03/15/2014.  FINDINGS: For the purposes of this dictation, the lowest well-formed intervertebral disc spaces presumed to be the L5-S1 level, and there presumed to be 5 lumbar type vertebral bodies.  The vertebral bodies are normally aligned with preservation of the normal lumbar lordosis. Vertebral body heights are well maintained. Signal intensity within the vertebral body bone marrow is within normal limits. The conus medullaris terminates at the L1 level.  Postoperative changes from recent posterior spinal decompression with fusion present at L4 thru S1. Bilateral transpedicular screws present at L4, L5, and S1. Intervertebral disc spacers present at L4-5 and L5-S1. The pedicular screws appear well positioned without encroachment on to the spinal canal. A T1 hypo intense, T2 hyperintense nonenhancing collection with internal components measuring 4.9 (craniocaudad) x 1.8 (transverse) x 1.8 (AP) cm is seen within the epidural space at  the dorsal aspect of the thecal sac (series 6, image 12). The superior aspect of the collection begins at the level of the mid L4 level. The inferior aspect of the collection terminates at the level of the L5-S1 intervertebral disc space. There is secondary severe compression of the nerve roots of the cauda equina at the level of the L4-5 intervertebral disc space as well as the L5 vertebral body. This is best appreciated on sagittal T2 weighted sequence (series 3, image 8).  At L1-2, there is minimal degenerative disc bulge without focal  disc protrusion. No significant facet arthrosis. No canal or neural foraminal stenosis.  At L2-3, no disc bulge or disc protrusion. The intervertebral disc is well hydrated. No significant facet arthrosis. No canal or neural foraminal stenosis.  At L3-4, no disc bulge or disc protrusion. Mild bilateral facet hypertrophy present with resultant mild bilateral foraminal stenosis. No significant canal stenosis.  IMPRESSION: 1. 4.9 x 1.8 x 1.8 cm collection within the dorsal epidural space at the L4-5 levels as above, highly concerning for epidural abscess. There is secondary severe compression upon the nerve roots of the cauda equina which are displaced and compressed anteriorly. 2. Postoperative changes from recent posterior spinal decompression with fixation at the L4 thru S1 levels. 3. Mild multilevel degenerative changes as above.   Electronically Signed   By: Rise MuBenjamin  McClintock M.D.   On: 03/21/2014 23:38   Ct Abdomen Pelvis W Contrast  03/28/2014   CLINICAL DATA:  Nausea and vomit  EXAM: CT ABDOMEN AND PELVIS WITH CONTRAST  TECHNIQUE: Multidetector CT imaging of the abdomen and pelvis was performed using the standard protocol following bolus administration of intravenous contrast.  CONTRAST:  100mL OMNIPAQUE IOHEXOL 300 MG/ML  SOLN  COMPARISON:  None.  FINDINGS: Bibasilar atelectasis.  Mild diffuse hepatic steatosis. The gallbladder, spleen, pancreas, adrenal glands are  within normal limits.  Right hydronephrosis, delayed nephro graphic phase, and delayed urinary excretion her associated with a 6 mm right ureterovesical junction calculus.  Normal appendix.  Unremarkable prostate and bladder.  Postoperative changes in lumbar spine are present from L4 through S1 fusion.  IMPRESSION: 6 mm right ureterovesical junction calculus is associated with secondary findings of right ureteral obstruction   Electronically Signed   By: Maryclare BeanArt  Hoss M.D.   On: 03/28/2014 02:17   Images viewed by me.   Dione Boozeavid Jaqulyn Chancellor, MD 03/28/14 [phone]

## 2014-03-28 NOTE — Progress Notes (Signed)
Patient discharged to home with instructions. 

## 2014-03-28 NOTE — Progress Notes (Signed)
Pt admitted after midnight. Chart reviewed. Pt examined. Flank pain, N/V better, but has not yet passed stone. Straining urine. No f/c Refusing SCDs. Advance diet, add flomax. Ambulate. Called consult to urology  Crista Curborinna Jacquilyn Seldon, M.D. [facility] [phone]

## 2014-03-28 NOTE — Consult Note (Signed)
Urology Consult  Referring physician: Osborn Reason for referral: Kidney stone  Chief Complaint: Kidney stone  History of Present Illness: Recent back surgery 03/15/14 and then 5/36/14 complicated by hematoma; midline abdominal pain and some rt sided pain; 6 mm stone near uv jx; elevated serum Cr; serum Cr 1.46 (baseline ,97); u/a rare bacteria and blood; serum Cr today is 1.16; having back pain from ? Surgery etc; WBC improving; had a CT scan with a 6 mm distal right ureteral stone; He came in last night due to significant mid back pain he said it felt like the hematoma pain; had vague rt flank discomfort; now no pain or n/v; no increased frequency; ER spoke to Dr Hartley Barefoot Baseline minimal LUTS Describes one u-scope in [place] and passed some stones Modifying factors: There are no other modifying factors  Associated signs and symptoms: There are no other associated signs and symptoms Aggravating and relieving factors: There are no other aggravating or relieving factors Severity: minimal now Duration: much better    Past Medical History  Diagnosis Date  . Headache(784.0)   . Arthritis     back   Past Surgical History  Procedure Laterality Date  . Knee surgery Left   . Maximum access (mas)posterior lumbar interbody fusion (plif) 2 level N/A 03/15/2014    Procedure: FOR MAXIMUM ACCESS (MAS) POSTERIOR LUMBAR INTERBODY FUSION (PLIF) 2 LEVEL four/five five/sacral one;  Surgeon: Eustace Moore, MD;  Location: [place] [facility];  Service: Neurosurgery;  Laterality: N/A;  . Laminectomy N/A 03/22/2014    Procedure: irrigation and debriedment of lumbar wound;  Surgeon: Floyce Stakes, MD;  Location: [place] [facility];  Service: Neurosurgery;  Laterality: N/A;    Medications: I have reviewed the patient's current medications. Allergies:  Allergies  Allergen Reactions  . Codeine Itching and Nausea And Vomiting    No family history on file. Social History:  reports that he has been smoking.  He has  never used smokeless tobacco. He reports that he drinks alcohol. He reports that he does not use illicit drugs.  ROS: All systems are reviewed and negative except as noted. Rest negative  Physical Exam:  Vital signs in last 24 hours: Temp:  [97.2 F (36.2 C)-98.9 F (37.2 C)] 97.2 F (36.2 C) (04/22 0957) Pulse Rate:  [74-100] 74 (04/22 0957) Resp:  [15-18] 16 (04/22 0957) BP: (96-134)/(57-89) 96/57 mmHg (04/22 0957) SpO2:  [92 %-99 %] 98 % (04/22 0957) Weight:  [92.352 kg (203 lb 9.6 oz)] 92.352 kg (203 lb 9.6 oz) (04/22 0510)  Cardiovascular: Skin warm; not flushed Respiratory: Breaths quiet; no shortness of breath Abdomen: No masses Neurological: Normal sensation to touch Musculoskeletal: Normal motor function arms and legs Lymphatics: No inguinal adenopathy Skin: No rashes Genitourinary:no CVA tender; non-toxic  Laboratory Data:  Results for orders placed during the hospital encounter of 03/27/14 (from the past 72 hour(s))  CBC     Status: Abnormal   Collection Time    03/27/14  9:20 PM      Result Value Ref Range   WBC 26.7 (*) 4.0 - 10.5 K/uL   RBC 4.32  4.22 - 5.81 MIL/uL   Hemoglobin 13.8  13.0 - 17.0 g/dL   HCT 40.2  39.0 - 52.0 %   MCV 93.1  78.0 - 100.0 fL   MCH 31.9  26.0 - 34.0 pg   MCHC 34.3  30.0 - 36.0 g/dL   RDW 14.0  11.5 - 15.5 %   Platelets 336  150 -  400 K/uL  COMPREHENSIVE METABOLIC PANEL     Status: Abnormal   Collection Time    03/27/14  9:20 PM      Result Value Ref Range   Sodium 138  137 - 147 mEq/L   Potassium 4.6  3.7 - 5.3 mEq/L   Chloride 101  96 - 112 mEq/L   CO2 26  19 - 32 mEq/L   Glucose, Bld 132 (*) 70 - 99 mg/dL   BUN 21  6 - 23 mg/dL   Creatinine, Ser 1.46 (*) 0.50 - 1.35 mg/dL   Calcium 10.0  8.4 - 10.5 mg/dL   Total Protein 7.1  6.0 - 8.3 g/dL   Albumin 3.4 (*) 3.5 - 5.2 g/dL   AST 17  0 - 37 U/L   ALT 30  0 - 53 U/L   Alkaline Phosphatase 118 (*) 39 - 117 U/L   Total Bilirubin 0.2 (*) 0.3 - 1.2 mg/dL   GFR calc non Af  Amer 58 (*) >90 mL/min   GFR calc Af Amer 67 (*) >90 mL/min   Comment: (NOTE)     The eGFR has been calculated using the CKD EPI equation.     This calculation has not been validated in all clinical situations.     eGFR's persistently <90 mL/min signify possible Chronic Kidney     Disease.  URINALYSIS, ROUTINE W REFLEX MICROSCOPIC     Status: Abnormal   Collection Time    03/27/14 10:33 PM      Result Value Ref Range   Color, Urine YELLOW  YELLOW   APPearance CLEAR  CLEAR   Specific Gravity, Urine 1.026  1.005 - 1.030   pH 6.0  5.0 - 8.0   Glucose, UA NEGATIVE  NEGATIVE mg/dL   Hgb urine dipstick MODERATE (*) NEGATIVE   Bilirubin Urine NEGATIVE  NEGATIVE   Ketones, ur NEGATIVE  NEGATIVE mg/dL   Protein, ur NEGATIVE  NEGATIVE mg/dL   Urobilinogen, UA 0.2  0.0 - 1.0 mg/dL   Nitrite NEGATIVE  NEGATIVE   Leukocytes, UA NEGATIVE  NEGATIVE  URINE MICROSCOPIC-ADD ON     Status: None   Collection Time    03/27/14 10:33 PM      Result Value Ref Range   Squamous Epithelial / LPF RARE  RARE   RBC / HPF 3-6  <3 RBC/hpf   Bacteria, UA RARE  RARE  BASIC METABOLIC PANEL     Status: Abnormal   Collection Time    03/28/14  6:00 AM      Result Value Ref Range   Sodium 139  137 - 147 mEq/L   Potassium 4.2  3.7 - 5.3 mEq/L   Chloride 103  96 - 112 mEq/L   CO2 22  19 - 32 mEq/L   Glucose, Bld 107 (*) 70 - 99 mg/dL   BUN 18  6 - 23 mg/dL   Creatinine, Ser 1.16  0.50 - 1.35 mg/dL   Calcium 9.0  8.4 - 10.5 mg/dL   GFR calc non Af Amer 76 (*) >90 mL/min   GFR calc Af Amer 88 (*) >90 mL/min   Comment: (NOTE)     The eGFR has been calculated using the CKD EPI equation.     This calculation has not been validated in all clinical situations.     eGFR's persistently <90 mL/min signify possible Chronic Kidney     Disease.  CBC     Status: Abnormal   Collection Time  03/28/14  6:00 AM      Result Value Ref Range   WBC 14.9 (*) 4.0 - 10.5 K/uL   RBC 4.11 (*) 4.22 - 5.81 MIL/uL   Hemoglobin  12.8 (*) 13.0 - 17.0 g/dL   HCT 38.2 (*) 39.0 - 52.0 %   MCV 92.9  78.0 - 100.0 fL   MCH 31.1  26.0 - 34.0 pg   MCHC 33.5  30.0 - 36.0 g/dL   RDW 13.9  11.5 - 15.5 %   Platelets 289  150 - 400 K/uL   Recent Results (from the past 240 hour(s))  WOUND CULTURE     Status: None   Collection Time    03/22/14  2:06 AM      Result Value Ref Range Status   Specimen Description WOUND BACK   Final   Special Requests NONE   Final   Gram Stain     Final   Value: RARE WBC PRESENT,BOTH PMN AND MONONUCLEAR     NO ORGANISMS SEEN     Performed at [facility]   Culture     Final   Value: NO GROWTH 2 DAYS     Performed at [facility]   Report Status 03/24/2014 FINAL   Final  AFB CULTURE WITH SMEAR     Status: None   Collection Time    03/22/14  2:06 AM      Result Value Ref Range Status   Specimen Description WOUND BACK   Final   Special Requests NONE   Final   Acid Fast Smear     Final   Value: NO ACID FAST BACILLI SEEN     Performed at [facility]   Culture     Final   Value: CULTURE WILL BE EXAMINED FOR 6 WEEKS BEFORE ISSUING A FINAL REPORT     Performed at [facility]   Report Status PENDING   Incomplete  GRAM STAIN     Status: None   Collection Time    03/22/14  2:06 AM      Result Value Ref Range Status   Specimen Description WOUND BACK   Final   Special Requests NONE   Final   Gram Stain     Final   Value: RARE WBC PRESENT,BOTH PMN AND MONONUCLEAR     NO ORGANISMS SEEN   Report Status 03/22/2014 FINAL   Final  ANAEROBIC CULTURE     Status: None   Collection Time    03/22/14  2:06 AM      Result Value Ref Range Status   Specimen Description WOUND BACK   Final   Special Requests NONE   Final   Gram Stain     Final   Value: RARE WBC PRESENT,BOTH PMN AND MONONUCLEAR     NO SQUAMOUS EPITHELIAL CELLS SEEN     NO ORGANISMS SEEN     Performed at [facility]   Culture     Final   Value: NO ANAEROBES ISOLATED     Performed at [facility]   Report Status 03/27/2014 FINAL   Final   Creatinine:  Recent Labs  03/21/14 2113 03/27/14 2120 03/28/14 0600  CREATININE 0.97 1.46* 1.16    Xrays: See report/chart Reviewed CT scan  Impression/Assessment:  Distal rt ureteral stone Now feeling well May go home with flomax..72m and see [facility] Tannebaum as outpt; send home with urine strainer as well Send home with pain meds and nausea  medications Picture drawn; indications to go to [facility] ER given  Plan:  As above  Jahnessa Vanduyn A Traniya Prichett 03/28/2014, 11:18 AM

## 2014-03-28 NOTE — Discharge Summary (Signed)
Physician Discharge Summary  Alvera Singhravis Anker NGE:[id number] DOB: 06/22/1971 DOA: 03/27/2014  PCP: Gweneth DimitriMCNEILL,WENDY, MD  Admit date: 03/27/2014 Discharge date: 03/28/2014  Recommendations for Outpatient Follow-up:  1. Strain urine 2. F/u urology  Discharge Diagnoses:  Principal Problem:   Nephrolithiasis Active Problems:   Intractable low back pain   S/P lumbar fusion   Leukocytosis, unspecified   ARF (acute renal failure)   Right ureteral calculus   Discharge Condition: stable  Filed Weights   03/28/14 0510  Weight: 92.352 kg (203 lb 9.6 oz)    History of present illness:  43 y.o. male with a recent Lumbar Laminectomy and Fusion then subsequent Hematoma evacuation and I+D who presents to the ED with complaints of intractable lower back pain since noon and Nausea and Vomiting X 1. He rates the pain at a 10/10. He denies any dysuria or cough. He reports having chills. In the ED, he was evaluated and a CT scan was performed and revealed a right ureteral stone,he was also found to have a leukocytosis and elevation in his BUN/Cr. UA negative for infection  Hospital Course:  Admitted to hospitalists. Started on IVF, pain and nausea medications.  Creatinine normalized. Pain and nausea improved. Urology consulted and cleared patient for discharge, follow up in their office.  As tolerating solids, and pain and nausea controlled, and renal function normalized, discharged  Procedures: None  Consultations:  urology  Discharge Exam: Filed Vitals:   03/28/14 1347  BP: 102/68  Pulse: 87  Temp: 97.4 F (36.3 C)  Resp: 18    General: comfortable Cardiovascular: RRR Respiratory: CTA Back: incision ok, no CVA tenderness  Discharge Orders   Future Orders Complete By Expires   Activity as tolerated - No restrictions  As directed    Diet general  As directed    Discharge instructions  As directed        Medication List    STOP taking these medications       meloxicam 7.5 MG  tablet  Commonly known as:  MOBIC     nortriptyline 50 MG capsule  Commonly known as:  PAMELOR      TAKE these medications       acetaminophen 325 MG tablet  Commonly known as:  TYLENOL  Take 2 tablets (650 mg total) by mouth every 6 (six) hours as needed for mild pain (or Fever >/= 101).     bisacodyl 5 MG EC tablet  Commonly known as:  DULCOLAX  Take 5 mg by mouth daily.     diazepam 5 MG tablet  Commonly known as:  VALIUM  Take 1-2 tablets (5-10 mg total) by mouth every 6 (six) hours as needed for muscle spasms.     morphine 15 MG 12 hr tablet  Commonly known as:  MS CONTIN  Take 1 tablet (15 mg total) by mouth every 12 (twelve) hours as needed for pain.     oxyCODONE-acetaminophen 5-325 MG per tablet  Commonly known as:  PERCOCET/ROXICET  Take 1-2 tablets by mouth every 4 (four) hours as needed for moderate pain.     polyethylene glycol packet  Commonly known as:  MIRALAX / GLYCOLAX  Take 17 g by mouth daily.     promethazine 12.5 MG tablet  Commonly known as:  PHENERGAN  Take 1 tablet (12.5 mg total) by mouth every 6 (six) hours as needed for nausea or vomiting.     tamsulosin 0.4 MG Caps capsule  Commonly known as:  FLOMAX  Take 1  capsule (0.4 mg total) by mouth daily.       Allergies  Allergen Reactions  . Codeine Itching and Nausea And Vomiting       Follow-up Information   Follow up with Kathi LudwigANNENBAUM, SIGMUND I, MD.   Specialty:  Urology   Contact information:   [address] [facility]  [place] [address] [phone]        The results of significant diagnostics from this hospitalization (including imaging, microbiology, ancillary and laboratory) are listed below for reference.    Significant Diagnostic Studies: Chest 2 View  03/05/2014   CLINICAL DATA:  Preoperative evaluation for lumbar surgery  EXAM: CHEST  2 VIEW  COMPARISON:  None.  FINDINGS: The heart size and mediastinal contours are within normal limits.  Both lungs are clear. The visualized skeletal structures are unremarkable.  IMPRESSION: No active cardiopulmonary disease.   Electronically Signed   By: Alcide CleverMark  Lukens M.D.   On: 03/05/2014 11:16   Dg Lumbar Spine 2-3 Views  03/15/2014   CLINICAL DATA:  Lumbar spine fusion  EXAM: DG C-ARM 61-120 MIN; LUMBAR SPINE - 2-3 VIEW  : COMPARISON:  09/26/2013  FINDINGS: Pedicle screws and interconnecting rods diffuse L4 through S1. The orthopedic hardware is well-seated and aligned. Radiolucent disc spacers maintaining disc height at these levels. These are well positioned. No evidence of an operative complication.  IMPRESSION: Lumbar spine fusion from L4 through S1 as described. Please refer to the procedure report for a complete description.   Electronically Signed   By: Amie Portlandavid  Ormond M.D.   On: 03/15/2014 12:58   Mr Lumbar Spine Wo Contrast  03/22/2014   CLINICAL DATA:  Surgery this morning to relieve fluid collection at the L4 and L5 level. Drain placement.  EXAM: MRI LUMBAR SPINE WITHOUT CONTRAST  TECHNIQUE: Multiplanar, multisequence MR imaging was performed. No intravenous contrast was administered.  COMPARISON:  03/21/2014  FINDINGS: There has been previous diskectomy, decompression and fusion from L4 to the sacrum. Since the prior study, there has been surgery to drains/removed a large fluid collection dorsal to the thecal sac in the operative head which was causing compression of the thecal sac anteriorly. The thecal sac canal appears completely decompressed. A surgical drain is present within the region previously occupied by the fluid collection. There is essentially no residual fluid demonstrable. No evidence of complication.  IMPRESSION: Drainage of a fluid collection posterior to the thecal sac at the L4-L5 levels. Drain in place. Complete evacuation of the fluid.   Electronically Signed   By: Paulina FusiMark  Shogry M.D.   On: 03/22/2014 20:01   Mr Lumbar Spine W Contrast  03/21/2014   CLINICAL DATA:  Progressive  pain in lower back status post recent back surgery on 03/15/2014.  EXAM: MRI LUMBAR SPINE WITH CONTRAST  TECHNIQUE: Multiplanar and multiecho pulse sequences of the lumbar spine were obtained with intravenous contrast.  CONTRAST:  20mL MULTIHANCE GADOBENATE DIMEGLUMINE 529 MG/ML IV SOLN  COMPARISON:  Prior study from 03/15/2014.  FINDINGS: For the purposes of this dictation, the lowest well-formed intervertebral disc spaces presumed to be the L5-S1 level, and there presumed to be 5 lumbar type vertebral bodies.  The vertebral bodies are normally aligned with preservation of the normal lumbar lordosis. Vertebral body heights are well maintained. Signal intensity within the vertebral body bone marrow is within normal limits. The conus medullaris terminates at the L1 level.  Postoperative changes from recent posterior spinal decompression with fusion present at L4 thru S1.  Bilateral transpedicular screws present at L4, L5, and S1. Intervertebral disc spacers present at L4-5 and L5-S1. The pedicular screws appear well positioned without encroachment on to the spinal canal. A T1 hypo intense, T2 hyperintense nonenhancing collection with internal components measuring 4.9 (craniocaudad) x 1.8 (transverse) x 1.8 (AP) cm is seen within the epidural space at the dorsal aspect of the thecal sac (series 6, image 12). The superior aspect of the collection begins at the level of the mid L4 level. The inferior aspect of the collection terminates at the level of the L5-S1 intervertebral disc space. There is secondary severe compression of the nerve roots of the cauda equina at the level of the L4-5 intervertebral disc space as well as the L5 vertebral body. This is best appreciated on sagittal T2 weighted sequence (series 3, image 8).  At L1-2, there is minimal degenerative disc bulge without focal disc protrusion. No significant facet arthrosis. No canal or neural foraminal stenosis.  At L2-3, no disc bulge or disc protrusion. The  intervertebral disc is well hydrated. No significant facet arthrosis. No canal or neural foraminal stenosis.  At L3-4, no disc bulge or disc protrusion. Mild bilateral facet hypertrophy present with resultant mild bilateral foraminal stenosis. No significant canal stenosis.  IMPRESSION: 1. 4.9 x 1.8 x 1.8 cm collection within the dorsal epidural space at the L4-5 levels as above, highly concerning for epidural abscess. There is secondary severe compression upon the nerve roots of the cauda equina which are displaced and compressed anteriorly. 2. Postoperative changes from recent posterior spinal decompression with fixation at the L4 thru S1 levels. 3. Mild multilevel degenerative changes as above.   Electronically Signed   By: Rise Mu M.D.   On: 03/21/2014 23:38   Ct Abdomen Pelvis W Contrast  03/28/2014   CLINICAL DATA:  Nausea and vomit  EXAM: CT ABDOMEN AND PELVIS WITH CONTRAST  TECHNIQUE: Multidetector CT imaging of the abdomen and pelvis was performed using the standard protocol following bolus administration of intravenous contrast.  CONTRAST:  OMNIPAQUE IOHEXOL 300 MG/ML  SOLN  COMPARISON:  None.  FINDINGS: Bibasilar atelectasis.  Mild diffuse hepatic steatosis. The gallbladder, spleen, pancreas, adrenal glands are within normal limits.  Right hydronephrosis, delayed nephro graphic phase, and delayed urinary excretion her associated with a 6 mm right ureterovesical junction calculus.  Normal appendix.  Unremarkable prostate and bladder.  Postoperative changes in lumbar spine are present from L4 through S1 fusion.  IMPRESSION: 6 mm right ureterovesical junction calculus is associated with secondary findings of right ureteral obstruction   Electronically Signed   By: Maryclare Bean M.D.   On: 03/28/2014 02:17   Dg Chest Portable 1 View  03/27/2014   CLINICAL DATA:  Low back pain with nausea and vomiting. Recent back surgery.  EXAM: PORTABLE CHEST - 1 VIEW  COMPARISON:  DG C-ARM 61-120 MIN  dated 03/15/2014; DG CHEST 2 VIEW dated 03/05/2014  FINDINGS: Shallow inspiration. The heart size and mediastinal contours are within normal limits. Both lungs are clear. The visualized skeletal structures are unremarkable.  IMPRESSION: No active disease.   Electronically Signed   By: Burman Nieves M.D.   On: 03/27/2014 22:19   Dg C-arm 61-120 Min  03/15/2014   CLINICAL DATA:  Lumbar spine fusion  EXAM: DG C-ARM 61-120 MIN; LUMBAR SPINE - 2-3 VIEW  : COMPARISON:  09/26/2013  FINDINGS: Pedicle screws and interconnecting rods diffuse L4 through S1. The orthopedic hardware is well-seated and aligned. Radiolucent disc spacers maintaining  disc height at these levels. These are well positioned. No evidence of an operative complication.  IMPRESSION: Lumbar spine fusion from L4 through S1 as described. Please refer to the procedure report for a complete description.   Electronically Signed   By: Amie Portland M.D.   On: 03/15/2014 12:58    Microbiology: Recent Results (from the past 240 hour(s))  WOUND CULTURE     Status: None   Collection Time    03/22/14  2:06 AM      Result Value Ref Range Status   Specimen Description WOUND BACK   Final   Special Requests NONE   Final   Gram Stain     Final   Value: RARE WBC PRESENT,BOTH PMN AND MONONUCLEAR     NO ORGANISMS SEEN     Performed at [facility]   Culture     Final   Value: NO GROWTH 2 DAYS     Performed at [facility]   Report Status 03/24/2014 FINAL   Final  AFB CULTURE WITH SMEAR     Status: None   Collection Time    03/22/14  2:06 AM      Result Value Ref Range Status   Specimen Description WOUND BACK   Final   Special Requests NONE   Final   Acid Fast Smear     Final   Value: NO ACID FAST BACILLI SEEN     Performed at [facility]   Culture     Final   Value: CULTURE WILL BE EXAMINED FOR 6 WEEKS BEFORE ISSUING A FINAL REPORT     Performed at [facility]   Report Status PENDING   Incomplete  GRAM STAIN      Status: None   Collection Time    03/22/14  2:06 AM      Result Value Ref Range Status   Specimen Description WOUND BACK   Final   Special Requests NONE   Final   Gram Stain     Final   Value: RARE WBC PRESENT,BOTH PMN AND MONONUCLEAR     NO ORGANISMS SEEN   Report Status 03/22/2014 FINAL   Final  ANAEROBIC CULTURE     Status: None   Collection Time    03/22/14  2:06 AM      Result Value Ref Range Status   Specimen Description WOUND BACK   Final   Special Requests NONE   Final   Gram Stain     Final   Value: RARE WBC PRESENT,BOTH PMN AND MONONUCLEAR     NO SQUAMOUS EPITHELIAL CELLS SEEN     NO ORGANISMS SEEN     Performed at [facility]   Culture     Final   Value: NO ANAEROBES ISOLATED     Performed at [facility]   Report Status 03/27/2014 FINAL   Final     Labs: Basic Metabolic Panel:  Recent Labs Lab 03/21/14 2113 03/27/14 2120 03/28/14 0600  NA 137 138 139  K 4.3 4.6 4.2  CL 102 101 103  CO2 24 26 22   GLUCOSE 101* 132* 107*  BUN 19 21 18   CREATININE 0.97 1.46* 1.16  CALCIUM 8.9 10.0 9.0   Liver Function Tests:  Recent Labs Lab 03/27/14 2120  AST 17  ALT 30  ALKPHOS 118*  BILITOT 0.2*  PROT 7.1  ALBUMIN 3.4*   No results found for this basename: LIPASE, AMYLASE,  in the last 168 hours  No results found for this basename: AMMONIA,  in the last 168 hours CBC:  Recent Labs Lab 03/21/14 2113 03/27/14 2120 03/28/14 0600  WBC 16.5* 26.7* 14.9*  NEUTROABS 9.7*  --   --   HGB 12.4* 13.8 12.8*  HCT 36.1* 40.2 38.2*  MCV 91.9 93.1 92.9  PLT 215 336 289   Cardiac Enzymes: No results found for this basename: CKTOTAL, CKMB, CKMBINDEX, TROPONINI,  in the last 168 hours BNP: BNP (last 3 results) No results found for this basename: PROBNP,  in the last 8760 hours CBG: No results found for this basename: GLUCAP,  in the last 168 hours     Signed:  Christiane Ha  [facility] 03/28/2014, 2:30 PM

## 2014-03-30 NOTE — ED Provider Notes (Signed)
I saw and evaluated the patient, reviewed the resident's note and I agree with the findings and plan.  Pt presented with severe back pain following recent spinal surgery with epidural hematoma complications.  In ED with severe pain.  Pt tearful, unable to get comfortable.  Ultimately CT scan revealed a ureteral stone causing renal colic.  Creatinine increased.  Admitted for pain management, monitoring of creatinine.  Celene KrasJon R Somnang Mahan, MD 03/30/14 [phone]

## 2014-04-09 DIAGNOSIS — N200 Calculus of kidney: Secondary | ICD-10-CM | POA: Diagnosis not present

## 2014-04-09 DIAGNOSIS — M545 Low back pain, unspecified: Secondary | ICD-10-CM | POA: Diagnosis not present

## 2014-04-09 DIAGNOSIS — T148XXA Other injury of unspecified body region, initial encounter: Secondary | ICD-10-CM | POA: Diagnosis not present

## 2014-05-01 DIAGNOSIS — Z6826 Body mass index (BMI) 26.0-26.9, adult: Secondary | ICD-10-CM | POA: Diagnosis not present

## 2014-05-01 DIAGNOSIS — M549 Dorsalgia, unspecified: Secondary | ICD-10-CM | POA: Diagnosis not present

## 2014-05-03 LAB — AFB CULTURE WITH SMEAR (NOT AT ARMC): Acid Fast Smear: NONE SEEN

## 2014-05-08 ENCOUNTER — Ambulatory Visit: Payer: Medicare Other | Attending: Neurological Surgery | Admitting: Physical Therapy

## 2014-05-08 DIAGNOSIS — M545 Low back pain, unspecified: Secondary | ICD-10-CM | POA: Insufficient documentation

## 2014-05-08 DIAGNOSIS — IMO0001 Reserved for inherently not codable concepts without codable children: Secondary | ICD-10-CM | POA: Diagnosis not present

## 2014-05-08 DIAGNOSIS — R5381 Other malaise: Secondary | ICD-10-CM | POA: Diagnosis not present

## 2014-05-10 ENCOUNTER — Ambulatory Visit: Payer: Medicare Other | Admitting: Physical Therapy

## 2014-05-10 DIAGNOSIS — IMO0001 Reserved for inherently not codable concepts without codable children: Secondary | ICD-10-CM | POA: Diagnosis not present

## 2014-05-14 ENCOUNTER — Ambulatory Visit: Payer: Medicare Other | Admitting: Physical Therapy

## 2014-05-14 DIAGNOSIS — IMO0001 Reserved for inherently not codable concepts without codable children: Secondary | ICD-10-CM | POA: Diagnosis not present

## 2014-05-17 ENCOUNTER — Ambulatory Visit: Payer: Medicare Other | Admitting: Physical Therapy

## 2014-05-17 DIAGNOSIS — IMO0001 Reserved for inherently not codable concepts without codable children: Secondary | ICD-10-CM | POA: Diagnosis not present

## 2014-05-21 ENCOUNTER — Ambulatory Visit: Payer: Medicare Other | Admitting: Physical Therapy

## 2014-05-21 DIAGNOSIS — IMO0001 Reserved for inherently not codable concepts without codable children: Secondary | ICD-10-CM | POA: Diagnosis not present

## 2014-05-24 ENCOUNTER — Ambulatory Visit: Payer: Medicare Other | Admitting: Physical Therapy

## 2014-05-24 DIAGNOSIS — IMO0001 Reserved for inherently not codable concepts without codable children: Secondary | ICD-10-CM | POA: Diagnosis not present

## 2014-05-29 ENCOUNTER — Ambulatory Visit: Payer: Medicare Other | Admitting: Physical Therapy

## 2014-05-29 DIAGNOSIS — IMO0001 Reserved for inherently not codable concepts without codable children: Secondary | ICD-10-CM | POA: Diagnosis not present

## 2014-05-31 ENCOUNTER — Ambulatory Visit: Payer: Medicare Other | Admitting: Physical Therapy

## 2014-05-31 DIAGNOSIS — IMO0001 Reserved for inherently not codable concepts without codable children: Secondary | ICD-10-CM | POA: Diagnosis not present

## 2014-06-04 ENCOUNTER — Ambulatory Visit: Payer: Medicare Other | Admitting: *Deleted

## 2014-06-04 DIAGNOSIS — IMO0001 Reserved for inherently not codable concepts without codable children: Secondary | ICD-10-CM | POA: Diagnosis not present

## 2014-06-07 ENCOUNTER — Ambulatory Visit: Payer: Medicare Other | Attending: Neurological Surgery | Admitting: Physical Therapy

## 2014-06-07 DIAGNOSIS — IMO0001 Reserved for inherently not codable concepts without codable children: Secondary | ICD-10-CM | POA: Insufficient documentation

## 2014-06-07 DIAGNOSIS — M545 Low back pain, unspecified: Secondary | ICD-10-CM | POA: Diagnosis not present

## 2014-06-07 DIAGNOSIS — R5381 Other malaise: Secondary | ICD-10-CM | POA: Insufficient documentation

## 2014-06-11 ENCOUNTER — Ambulatory Visit: Payer: Medicare Other | Admitting: Physical Therapy

## 2014-06-11 DIAGNOSIS — IMO0001 Reserved for inherently not codable concepts without codable children: Secondary | ICD-10-CM | POA: Diagnosis not present

## 2014-06-14 ENCOUNTER — Ambulatory Visit: Payer: Medicare Other | Admitting: Physical Therapy

## 2014-06-14 DIAGNOSIS — IMO0001 Reserved for inherently not codable concepts without codable children: Secondary | ICD-10-CM | POA: Diagnosis not present

## 2014-07-31 DIAGNOSIS — Z6826 Body mass index (BMI) 26.0-26.9, adult: Secondary | ICD-10-CM | POA: Diagnosis not present

## 2014-07-31 DIAGNOSIS — M549 Dorsalgia, unspecified: Secondary | ICD-10-CM | POA: Diagnosis not present

## 2014-09-16 ENCOUNTER — Encounter (HOSPITAL_COMMUNITY): Payer: Self-pay | Admitting: Emergency Medicine

## 2014-09-16 ENCOUNTER — Emergency Department (HOSPITAL_COMMUNITY)
Admission: EM | Admit: 2014-09-16 | Discharge: 2014-09-16 | Disposition: A | Discharge status: Home / Self Care | Payer: Medicare Other | Attending: Emergency Medicine | Admitting: Emergency Medicine

## 2014-09-16 ENCOUNTER — Emergency Department (HOSPITAL_COMMUNITY): Payer: Medicare Other

## 2014-09-16 DIAGNOSIS — Z720 Tobacco use: Secondary | ICD-10-CM | POA: Diagnosis not present

## 2014-09-16 DIAGNOSIS — M545 Low back pain: Secondary | ICD-10-CM | POA: Diagnosis not present

## 2014-09-16 DIAGNOSIS — W010XXA Fall on same level from slipping, tripping and stumbling without subsequent striking against object, initial encounter: Secondary | ICD-10-CM | POA: Insufficient documentation

## 2014-09-16 DIAGNOSIS — Y92009 Unspecified place in unspecified non-institutional (private) residence as the place of occurrence of the external cause: Secondary | ICD-10-CM | POA: Diagnosis not present

## 2014-09-16 DIAGNOSIS — M1388 Other specified arthritis, other site: Secondary | ICD-10-CM | POA: Insufficient documentation

## 2014-09-16 DIAGNOSIS — Y9389 Activity, other specified: Secondary | ICD-10-CM | POA: Diagnosis not present

## 2014-09-16 DIAGNOSIS — G8929 Other chronic pain: Secondary | ICD-10-CM | POA: Insufficient documentation

## 2014-09-16 DIAGNOSIS — S3992XA Unspecified injury of lower back, initial encounter: Secondary | ICD-10-CM | POA: Diagnosis not present

## 2014-09-16 DIAGNOSIS — Z79899 Other long term (current) drug therapy: Secondary | ICD-10-CM | POA: Diagnosis not present

## 2014-09-16 DIAGNOSIS — M5459 Other low back pain: Secondary | ICD-10-CM

## 2014-09-16 MED ORDER — KETOROLAC TROMETHAMINE 60 MG/2ML IM SOLN
60.0000 mg | Freq: Once | INTRAMUSCULAR | Status: AC
  Administered 2014-09-16: 60 mg via INTRAMUSCULAR
  Filled 2014-09-16: qty 2

## 2014-09-16 MED ORDER — DIAZEPAM 5 MG PO TABS: 10.0000 mg | ORAL_TABLET | Freq: Once | ORAL | Status: DC

## 2014-09-16 MED ORDER — PREDNISONE 10 MG PO TABS: 20.0000 mg | ORAL_TABLET | Freq: Every day | ORAL | Status: DC

## 2014-09-16 MED ORDER — ONDANSETRON 4 MG PO TBDP
8.0000 mg | ORAL_TABLET | Freq: Once | ORAL | Status: AC
  Administered 2014-09-16: 8 mg via ORAL
  Filled 2014-09-16: qty 2

## 2014-09-16 MED ORDER — DIAZEPAM 5 MG PO TABS
5.0000 mg | ORAL_TABLET | Freq: Once | ORAL | Status: AC
  Administered 2014-09-16: 5 mg via ORAL
  Filled 2014-09-16: qty 1

## 2014-09-16 MED ORDER — MORPHINE SULFATE 10 MG/ML IJ SOLN
10.0000 mg | Freq: Once | INTRAMUSCULAR | Status: AC
  Administered 2014-09-16: 10 mg via INTRAMUSCULAR
  Filled 2014-09-16: qty 1

## 2014-09-16 MED ORDER — PREDNISONE 20 MG PO TABS
60.0000 mg | ORAL_TABLET | Freq: Once | ORAL | Status: AC
  Administered 2014-09-16: 60 mg via ORAL
  Filled 2014-09-16: qty 3

## 2014-09-16 MED ORDER — HYDROMORPHONE HCL 1 MG/ML IJ SOLN
2.0000 mg | Freq: Once | INTRAMUSCULAR | Status: AC
  Administered 2014-09-16: 2 mg via INTRAMUSCULAR
  Filled 2014-09-16: qty 2

## 2014-09-16 MED ORDER — LORAZEPAM 2 MG/ML IJ SOLN
2.0000 mg | Freq: Once | INTRAMUSCULAR | Status: AC
  Administered 2014-09-16: 2 mg via INTRAMUSCULAR
  Filled 2014-09-16: qty 1

## 2014-09-16 NOTE — ED Notes (Signed)
Pt. Slipped and fell at home this evening , reports right low back pain radiating to left leg , no LOC , alert and oriented/respirations unlabored .

## 2014-09-16 NOTE — ED Notes (Signed)
Pain ,med given

## 2014-09-16 NOTE — ED Provider Notes (Signed)
CSN: [id number]     Arrival date & time 09/16/14  0016 History   First MD Initiated Contact with Patient 09/16/14 0151     Chief Complaint  Patient presents with  . Back Pain     (Consider location/radiation/quality/duration/timing/severity/associated sxs/prior Treatment) HPI Comments: Patient here after slipping and falling prior to arrival and now notes pain to lower back. Has history of chronic back pain. No bowel or bladder dysfunction. Pain characterized as sharp and worse with movement and localized to his lower lumbar spine. He has chronic left foot numbness from his back pain. She does her persistent and no treatment used prior to arrival.  Patient is a 43 y.o. male presenting with back pain. The history is provided by the patient.  Back Pain   Past Medical History  Diagnosis Date  . Headache(784.0)   . Arthritis     back   Past Surgical History  Procedure Laterality Date  . Knee surgery Left   . Maximum access (mas)posterior lumbar interbody fusion (plif) 2 level N/A 03/15/2014    Procedure: FOR MAXIMUM ACCESS (MAS) POSTERIOR LUMBAR INTERBODY FUSION (PLIF) 2 LEVEL four/five five/sacral one;  Surgeon: Tia Alertavid S Jones, MD;  Location: [facility];  Service: Neurosurgery;  Laterality: N/A;  . Laminectomy N/A 03/22/2014    Procedure: irrigation and debriedment of lumbar wound;  Surgeon: Karn CassisErnesto M Botero, MD;  Location: [facility];  Service: Neurosurgery;  Laterality: N/A;   No family history on file. History  Substance Use Topics  . Smoking status: Current Every Day Smoker -- 1.00 packs/day for 26 years  . Smokeless tobacco: Never Used  . Alcohol Use: Yes     Comment: social    Review of Systems  Musculoskeletal: Positive for back pain.  All other systems reviewed and are negative.     Allergies  Codeine  Home Medications   Prior to Admission medications   Medication Sig Start Date End Date Taking? Authorizing Provider  acetaminophen (TYLENOL) 325 MG tablet  Take 2 tablets (650 mg total) by mouth every 6 (six) hours as needed for mild pain (or Fever >/= 101). 03/28/14   Christiane Haorinna L Sullivan, MD  bisacodyl (DULCOLAX) 5 MG EC tablet Take 5 mg by mouth daily.    Historical Provider, MD  diazepam (VALIUM) 5 MG tablet Take 1-2 tablets (5-10 mg total) by mouth every 6 (six) hours as needed for muscle spasms. 03/17/14   Temple PaciniHenry A Pool, MD  morphine (MS CONTIN) 15 MG 12 hr tablet Take 1 tablet (15 mg total) by mouth every 12 (twelve) hours as needed for pain. 03/17/14   Temple PaciniHenry A Pool, MD  oxyCODONE-acetaminophen (PERCOCET/ROXICET) 5-325 MG per tablet Take 1-2 tablets by mouth every 4 (four) hours as needed for moderate pain. 03/28/14   Christiane Haorinna L Sullivan, MD  polyethylene glycol (MIRALAX / GLYCOLAX) packet Take 17 g by mouth daily.    Historical Provider, MD  promethazine (PHENERGAN) 12.5 MG tablet Take 1 tablet (12.5 mg total) by mouth every 6 (six) hours as needed for nausea or vomiting. 03/28/14   Christiane Haorinna L Sullivan, MD  tamsulosin (FLOMAX) 0.4 MG CAPS capsule Take 1 capsule (0.4 mg total) by mouth daily. 03/28/14   Christiane Haorinna L Sullivan, MD   BP 109/91  Pulse 82  Temp(Src) 97.9 F (36.6 C) (Oral)  Resp 14  Ht 6\' 1"  (1.854 m)  Wt 187 lb (84.823 kg)  BMI 24.68 kg/m2  SpO2 100% Physical Exam  Nursing note and vitals reviewed. Constitutional: He  is oriented to person, place, and time. He appears well-developed and well-nourished.  Non-toxic appearance. No distress.  HENT:  Head: Normocephalic and atraumatic.  Eyes: Conjunctivae, EOM and lids are normal. Pupils are equal, round, and reactive to light.  Neck: Normal range of motion. Neck supple. No tracheal deviation present. No mass present.  Cardiovascular: Normal rate, regular rhythm and normal heart sounds.  Exam reveals no gallop.   No murmur heard. Pulmonary/Chest: Effort normal and breath sounds normal. No stridor. No respiratory distress. He has no decreased breath sounds. He has no wheezes. He has no  rhonchi. He has no rales.  Abdominal: Soft. Normal appearance and bowel sounds are normal. He exhibits no distension. There is no tenderness. There is no rebound and no CVA tenderness.  Musculoskeletal: Normal range of motion. He exhibits no edema and no tenderness.       Back:  Neurological: He is alert and oriented to person, place, and time. He has normal strength. No cranial nerve deficit or sensory deficit. GCS eye subscore is 4. GCS verbal subscore is 5. GCS motor subscore is 6.  Reflex Scores:      Patellar reflexes are 1+ on the right side and 1+ on the left side. Skin: Skin is warm and dry. No abrasion and no rash noted.  Psychiatric: He has a normal mood and affect. His speech is normal and behavior is normal.    ED Course  Procedures (including critical care time) Labs Review Labs Reviewed - No data to display  Imaging Review Dg Lumbar Spine Complete  09/16/2014   CLINICAL DATA:  Status post fall from standing, with numbness at the left lateral lower extremity. Right lower back pain, radiating down the left leg. Initial encounter.  EXAM: LUMBAR SPINE - COMPLETE 4+ VIEW  COMPARISON:  Lumbar spine radiographs from 07/31/2014  FINDINGS: There is no evidence of fracture or subluxation. The patient is status post lumbar spinal fusion at L4-S1, with associated spacers. Vertebral bodies demonstrate normal height and alignment. Intervertebral disc spaces are preserved. The visualized neural foramina are grossly unremarkable in appearance.  The visualized bowel gas pattern is unremarkable in appearance; air and stool are noted within the colon. The sacroiliac joints are within normal limits.  IMPRESSION: No evidence of fracture or subluxation along the lumbar spine. Status post lumbar spinal fusion at L4-S1; visualized hardware is grossly stable in appearance.   Electronically Signed   By: Roanna RaiderJeffery  Chang M.D.   On: 09/16/2014 01:33     EKG Interpretation None      MDM   Final  diagnoses:  None    Patient given pain medications here feels better. No neurological deficits noted. Will followup with his neurosurgeon next week and I will give patient a prescription for prednisone    Toy BakerAnthony T Elita Dame, MD 09/16/14 [phone]

## 2014-09-16 NOTE — ED Notes (Signed)
The pt is c/o back pain after he fell yesterday.  Back surgery in the past.  Nausea today,

## 2014-09-16 NOTE — ED Notes (Signed)
C/o severe back pain 

## 2014-09-16 NOTE — ED Notes (Signed)
C/o pain   Still.  Pain med  Given again

## 2014-09-16 NOTE — Discharge Instructions (Signed)
Back Pain, Adult Low back pain is very common. About 1 in 5 people have back pain.The cause of low back pain is rarely dangerous. The pain often gets better over time.About half of people with a sudden onset of back pain feel better in just 2 weeks. About 8 in 10 people feel better by 6 weeks.  CAUSES Some common causes of back pain include:  Strain of the muscles or ligaments supporting the spine.  Wear and tear (degeneration) of the spinal discs.  Arthritis.  Direct injury to the back. DIAGNOSIS Most of the time, the direct cause of low back pain is not known.However, back pain can be treated effectively even when the exact cause of the pain is unknown.Answering your caregiver's questions about your overall health and symptoms is one of the most accurate ways to make sure the cause of your pain is not dangerous. If your caregiver needs more information, he or she may order lab work or imaging tests (X-rays or MRIs).However, even if imaging tests show changes in your back, this usually does not require surgery. HOME CARE INSTRUCTIONS For many people, back pain returns.Since low back pain is rarely dangerous, it is often a condition that people can learn to manageon their own.   Remain active. It is stressful on the back to sit or stand in one place. Do not sit, drive, or stand in one place for more than 30 minutes at a time. Take short walks on level surfaces as soon as pain allows.Try to increase the length of time you walk each day.  Do not stay in bed.Resting more than 1 or 2 days can delay your recovery.  Do not avoid exercise or work.Your body is made to move.It is not dangerous to be active, even though your back may hurt.Your back will likely heal faster if you return to being active before your pain is gone.  Pay attention to your body when you bend and lift. Many people have less discomfortwhen lifting if they bend their knees, keep the load close to their bodies,and  avoid twisting. Often, the most comfortable positions are those that put less stress on your recovering back.  Find a comfortable position to sleep. Use a firm mattress and lie on your side with your knees slightly bent. If you lie on your back, put a pillow under your knees.  Only take over-the-counter or prescription medicines as directed by your caregiver. Over-the-counter medicines to reduce pain and inflammation are often the most helpful.Your caregiver may prescribe muscle relaxant drugs.These medicines help dull your pain so you can more quickly return to your normal activities and healthy exercise.  Put ice on the injured area.  Put ice in a plastic bag.  Place a towel between your skin and the bag.  Leave the ice on for 15-20 minutes, 03-04 times a day for the first 2 to 3 days. After that, ice and heat may be alternated to reduce pain and spasms.  Ask your caregiver about trying back exercises and gentle massage. This may be of some benefit.  Avoid feeling anxious or stressed.Stress increases muscle tension and can worsen back pain.It is important to recognize when you are anxious or stressed and learn ways to manage it.Exercise is a great option. SEEK MEDICAL CARE IF:  You have pain that is not relieved with rest or medicine.  You have pain that does not improve in 1 week.  You have new symptoms.  You are generally not feeling well. SEEK   IMMEDIATE MEDICAL CARE IF:   You have pain that radiates from your back into your legs.  You develop new bowel or bladder control problems.  You have unusual weakness or numbness in your arms or legs.  You develop nausea or vomiting.  You develop abdominal pain.  You feel faint. Document Released: 11/23/2005 Document Revised: 05/24/2012 Document Reviewed: 03/27/2014 Exit[employer] Patient Information 2015 [employer]. This information is not intended to replace advice given to you by your health care provider. Make sure you  discuss any questions you have with your health care provider.  

## 2014-09-21 ENCOUNTER — Other Ambulatory Visit: Payer: Self-pay | Admitting: Neurological Surgery

## 2014-09-21 DIAGNOSIS — M545 Low back pain: Secondary | ICD-10-CM | POA: Diagnosis not present

## 2014-09-24 ENCOUNTER — Ambulatory Visit
Admission: RE | Admit: 2014-09-24 | Discharge: 2014-09-24 | Disposition: A | Discharge status: Home / Self Care | Payer: Medicare Other | Source: Ambulatory Visit | Attending: Neurological Surgery | Admitting: Neurological Surgery

## 2014-09-24 DIAGNOSIS — S3992XA Unspecified injury of lower back, initial encounter: Secondary | ICD-10-CM | POA: Diagnosis not present

## 2014-09-24 DIAGNOSIS — M5126 Other intervertebral disc displacement, lumbar region: Secondary | ICD-10-CM | POA: Diagnosis not present

## 2014-09-24 DIAGNOSIS — M545 Low back pain: Secondary | ICD-10-CM

## 2014-09-24 DIAGNOSIS — M4326 Fusion of spine, lumbar region: Secondary | ICD-10-CM | POA: Diagnosis not present

## 2014-09-25 DIAGNOSIS — S32009K Unspecified fracture of unspecified lumbar vertebra, subsequent encounter for fracture with nonunion: Secondary | ICD-10-CM | POA: Diagnosis not present

## 2014-09-25 DIAGNOSIS — Z6826 Body mass index (BMI) 26.0-26.9, adult: Secondary | ICD-10-CM | POA: Diagnosis not present

## 2014-11-05 DIAGNOSIS — S32009K Unspecified fracture of unspecified lumbar vertebra, subsequent encounter for fracture with nonunion: Secondary | ICD-10-CM | POA: Diagnosis not present

## 2014-11-05 DIAGNOSIS — Z6826 Body mass index (BMI) 26.0-26.9, adult: Secondary | ICD-10-CM | POA: Diagnosis not present

## 2014-12-31 ENCOUNTER — Other Ambulatory Visit: Payer: Self-pay | Admitting: Neurological Surgery

## 2014-12-31 DIAGNOSIS — M545 Low back pain: Secondary | ICD-10-CM | POA: Diagnosis not present

## 2014-12-31 DIAGNOSIS — Z6826 Body mass index (BMI) 26.0-26.9, adult: Secondary | ICD-10-CM | POA: Diagnosis not present

## 2014-12-31 DIAGNOSIS — S32009K Unspecified fracture of unspecified lumbar vertebra, subsequent encounter for fracture with nonunion: Secondary | ICD-10-CM | POA: Diagnosis not present

## 2015-05-27 ENCOUNTER — Ambulatory Visit
Admission: RE | Admit: 2015-05-27 | Discharge: 2015-05-27 | Disposition: A | Discharge status: Home / Self Care | Payer: Medicare Other | Source: Ambulatory Visit | Attending: Neurological Surgery | Admitting: Neurological Surgery

## 2015-05-27 DIAGNOSIS — M5126 Other intervertebral disc displacement, lumbar region: Secondary | ICD-10-CM | POA: Diagnosis not present

## 2015-05-27 DIAGNOSIS — Z6827 Body mass index (BMI) 27.0-27.9, adult: Secondary | ICD-10-CM | POA: Diagnosis not present

## 2015-05-27 DIAGNOSIS — M545 Low back pain: Secondary | ICD-10-CM

## 2015-05-27 DIAGNOSIS — Z981 Arthrodesis status: Secondary | ICD-10-CM | POA: Diagnosis not present

## 2015-06-28 DIAGNOSIS — N200 Calculus of kidney: Secondary | ICD-10-CM | POA: Diagnosis not present

## 2015-06-28 DIAGNOSIS — Z79899 Other long term (current) drug therapy: Secondary | ICD-10-CM | POA: Diagnosis not present

## 2015-06-28 DIAGNOSIS — M545 Low back pain: Secondary | ICD-10-CM | POA: Diagnosis not present

## 2015-06-28 DIAGNOSIS — Z1389 Encounter for screening for other disorder: Secondary | ICD-10-CM | POA: Diagnosis not present

## 2015-06-28 DIAGNOSIS — D72829 Elevated white blood cell count, unspecified: Secondary | ICD-10-CM | POA: Diagnosis not present

## 2015-06-28 DIAGNOSIS — F17200 Nicotine dependence, unspecified, uncomplicated: Secondary | ICD-10-CM | POA: Diagnosis not present

## 2015-08-26 DIAGNOSIS — Z6827 Body mass index (BMI) 27.0-27.9, adult: Secondary | ICD-10-CM | POA: Diagnosis not present

## 2015-08-26 DIAGNOSIS — M545 Low back pain: Secondary | ICD-10-CM | POA: Diagnosis not present

## 2015-09-06 DIAGNOSIS — M961 Postlaminectomy syndrome, not elsewhere classified: Secondary | ICD-10-CM | POA: Diagnosis not present

## 2015-09-06 DIAGNOSIS — Z6827 Body mass index (BMI) 27.0-27.9, adult: Secondary | ICD-10-CM | POA: Diagnosis not present

## 2015-09-06 DIAGNOSIS — G8929 Other chronic pain: Secondary | ICD-10-CM | POA: Diagnosis not present

## 2015-09-06 DIAGNOSIS — M5440 Lumbago with sciatica, unspecified side: Secondary | ICD-10-CM | POA: Diagnosis not present

## 2015-09-06 DIAGNOSIS — M461 Sacroiliitis, not elsewhere classified: Secondary | ICD-10-CM | POA: Diagnosis not present

## 2015-09-10 DIAGNOSIS — M461 Sacroiliitis, not elsewhere classified: Secondary | ICD-10-CM | POA: Diagnosis not present

## 2015-09-10 DIAGNOSIS — Z6827 Body mass index (BMI) 27.0-27.9, adult: Secondary | ICD-10-CM | POA: Diagnosis not present

## 2015-10-01 ENCOUNTER — Other Ambulatory Visit (HOSPITAL_COMMUNITY): Payer: Self-pay | Admitting: Neurological Surgery

## 2015-10-01 DIAGNOSIS — M961 Postlaminectomy syndrome, not elsewhere classified: Secondary | ICD-10-CM | POA: Diagnosis not present

## 2015-10-01 DIAGNOSIS — Z6827 Body mass index (BMI) 27.0-27.9, adult: Secondary | ICD-10-CM | POA: Diagnosis not present

## 2015-12-17 ENCOUNTER — Other Ambulatory Visit (HOSPITAL_COMMUNITY): Payer: Self-pay | Admitting: *Deleted

## 2015-12-17 NOTE — Pre-Procedure Instructions (Signed)
    Alvera Singhravis Agyeman  12/17/2015      [id number]les- [place], [place] - [address] [place] [address] Phone: [phone] Fax: [phone]    Your procedure is scheduled on Friday, December 27, 2015 at 7:30 AM.   [facility] at 5:30 AM.   Call this number if you have problems the morning of surgery: [phone]   Any questions prior to day of surgery, please call [phone] between 8 & 4 PM.   Remember:  Do not eat food or drink liquids after midnight Thursday, 12/26/15.  Take these medicines the morning of surgery with A SIP OF WATER: Morphine - if needed   Do not wear jewelry.  Do not wear lotions, powders, or cologne.  You may wear deodorant.  Men may shave face and neck.  Do not bring valuables to the hospital.  [facility] is not responsible for any belongings or valuables.  Contacts, dentures or bridgework may not be worn into surgery.  Leave your suitcase in the car.  After surgery it may be brought to your room.  For patients admitted to the hospital, discharge time will be determined by your treatment team.  Special instructions:  See "Preparing for Surgery" Instruction sheet.  Please read over the following fact sheets that you were given. Pain Booklet, Coughing and Deep Breathing, Blood Transfusion Information, MRSA Information and Surgical Site Infection Prevention

## 2015-12-18 ENCOUNTER — Encounter (HOSPITAL_COMMUNITY)
Admission: RE | Admit: 2015-12-18 | Discharge: 2015-12-18 | Disposition: A | Discharge status: Home / Self Care | Payer: Medicare Other | Source: Ambulatory Visit | Attending: Neurological Surgery | Admitting: Neurological Surgery

## 2015-12-18 ENCOUNTER — Encounter (HOSPITAL_COMMUNITY): Payer: Self-pay

## 2015-12-18 DIAGNOSIS — M539 Dorsopathy, unspecified: Secondary | ICD-10-CM | POA: Diagnosis not present

## 2015-12-18 DIAGNOSIS — Z01818 Encounter for other preprocedural examination: Secondary | ICD-10-CM | POA: Diagnosis not present

## 2015-12-18 DIAGNOSIS — Z01812 Encounter for preprocedural laboratory examination: Secondary | ICD-10-CM | POA: Insufficient documentation

## 2015-12-18 DIAGNOSIS — Z87891 Personal history of nicotine dependence: Secondary | ICD-10-CM | POA: Insufficient documentation

## 2015-12-18 DIAGNOSIS — M961 Postlaminectomy syndrome, not elsewhere classified: Secondary | ICD-10-CM

## 2015-12-18 DIAGNOSIS — Z0183 Encounter for blood typing: Secondary | ICD-10-CM | POA: Diagnosis not present

## 2015-12-18 LAB — BASIC METABOLIC PANEL
Anion gap: 9 (ref 5–15)
BUN: 10 mg/dL (ref 6–20)
CO2: 24 mmol/L (ref 22–32)
CREATININE: 1.16 mg/dL (ref 0.61–1.24)
Calcium: 9.4 mg/dL (ref 8.9–10.3)
Chloride: 106 mmol/L (ref 101–111)
GFR calc Af Amer: >60 mL/min (ref >60)
GLUCOSE: 111 mg/dL — AB (ref 65–99)
POTASSIUM: 4 mmol/L (ref 3.5–5.1)
Sodium: 139 mmol/L (ref 135–145)

## 2015-12-18 LAB — CBC WITH DIFFERENTIAL/PLATELET
BASOS ABS: 0 10*3/uL (ref 0.0–0.1)
BASOS PCT: 0 %
Eosinophils Absolute: 0.2 10*3/uL (ref 0.0–0.7)
Eosinophils Relative: 1 %
HEMATOCRIT: 49 % (ref 39.0–52.0)
HEMOGLOBIN: 16.8 g/dL (ref 13.0–17.0)
Lymphocytes Relative: 19 %
Lymphs Abs: 2.4 10*3/uL (ref 0.7–4.0)
MCH: 31.9 pg (ref 26.0–34.0)
MCHC: 34.3 g/dL (ref 30.0–36.0)
MCV: 93.2 fL (ref 78.0–100.0)
Monocytes Absolute: 0.9 10*3/uL (ref 0.1–1.0)
Monocytes Relative: 7 %
NEUTROS ABS: 9.6 10*3/uL — AB (ref 1.7–7.7)
NEUTROS PCT: 73 %
Platelets: 193 10*3/uL (ref 150–400)
RBC: 5.26 MIL/uL (ref 4.22–5.81)
RDW: 13.5 % (ref 11.5–15.5)
WBC: 13.1 10*3/uL — ABNORMAL HIGH (ref 4.0–10.5)

## 2015-12-18 LAB — TYPE AND SCREEN
ABO/RH(D): AB POS
Antibody Screen: NEGATIVE

## 2015-12-18 LAB — SURGICAL PCR SCREEN
MRSA, PCR: NEGATIVE
Staphylococcus aureus: NEGATIVE

## 2015-12-18 LAB — PROTIME-INR
INR: 1.05 (ref 0.00–1.49)
Prothrombin Time: 13.9 seconds (ref 11.6–15.2)

## 2015-12-26 MED ORDER — CEFAZOLIN SODIUM-DEXTROSE 2-3 GM-% IV SOLR
2.0000 g | INTRAVENOUS | Status: AC
  Administered 2015-12-27: 2 g via INTRAVENOUS
  Filled 2015-12-26: qty 50

## 2015-12-26 NOTE — Progress Notes (Signed)
Left a voicemail on both phone #'s instructing pt to arrive at 6:00 AM tomorrow.

## 2015-12-26 NOTE — Anesthesia Preprocedure Evaluation (Addendum)
Anesthesia Evaluation  Patient identified by MRN, date of birth, ID band Patient awake    Reviewed: Allergy & Precautions, NPO status , Patient's Chart, lab work & pertinent test results  Airway Mallampati: II  TM Distance: >3 FB Neck ROM: Full    Dental  (+) Teeth Intact, Dental Advisory Given   Pulmonary neg pulmonary ROS, Current Smoker,    breath sounds clear to auscultation       Cardiovascular negative cardio ROS   Rhythm:Regular Rate:Normal     Neuro/Psych negative neurological ROS     GI/Hepatic negative GI ROS, Neg liver ROS,   Endo/Other  negative endocrine ROS  Renal/GU Renal InsufficiencyRenal disease     Musculoskeletal  (+) Arthritis ,   Abdominal   Peds  Hematology negative hematology ROS (+)   Anesthesia Other Findings   Reproductive/Obstetrics                            Lab Results  Component Value Date   WBC 13.1* 12/18/2015   HGB 16.8 12/18/2015   HCT 49.0 12/18/2015   MCV 93.2 12/18/2015   PLT 193 12/18/2015   Lab Results  Component Value Date   CREATININE 1.16 12/18/2015   BUN 10 12/18/2015   NA 139 12/18/2015   K 4.0 12/18/2015   CL 106 12/18/2015   CO2 24 12/18/2015    Anesthesia Physical Anesthesia Plan  ASA: II  Anesthesia Plan: General   Post-op Pain Management:    Induction: Intravenous  Airway Management Planned: Oral ETT  Additional Equipment:   Intra-op Plan:   Post-operative Plan: Extubation in OR  Informed Consent: I have reviewed the patients History and Physical, chart, labs and discussed the procedure including the risks, benefits and alternatives for the proposed anesthesia with the patient or authorized representative who has indicated his/her understanding and acceptance.   Dental advisory given  Plan Discussed with: CRNA  Anesthesia Plan Comments:         Anesthesia Quick Evaluation

## 2015-12-27 ENCOUNTER — Ambulatory Visit (HOSPITAL_COMMUNITY)
Admission: RE | Admit: 2015-12-27 | Discharge: 2015-12-28 | Disposition: A | Discharge status: Home / Self Care | Payer: Medicare Other | Source: Ambulatory Visit | Attending: Neurological Surgery | Admitting: Neurological Surgery

## 2015-12-27 ENCOUNTER — Encounter (HOSPITAL_COMMUNITY): Payer: Self-pay

## 2015-12-27 ENCOUNTER — Inpatient Hospital Stay (HOSPITAL_COMMUNITY): Payer: Medicare Other | Admitting: Certified Registered Nurse Anesthetist

## 2015-12-27 ENCOUNTER — Encounter (HOSPITAL_COMMUNITY)
Admission: RE | Disposition: A | Discharge status: Home / Self Care | Payer: Self-pay | Source: Ambulatory Visit | Attending: Neurological Surgery

## 2015-12-27 DIAGNOSIS — Y838 Other surgical procedures as the cause of abnormal reaction of the patient, or of later complication, without mention of misadventure at the time of the procedure: Secondary | ICD-10-CM | POA: Insufficient documentation

## 2015-12-27 DIAGNOSIS — F17200 Nicotine dependence, unspecified, uncomplicated: Secondary | ICD-10-CM | POA: Diagnosis not present

## 2015-12-27 DIAGNOSIS — M960 Pseudarthrosis after fusion or arthrodesis: Principal | ICD-10-CM | POA: Insufficient documentation

## 2015-12-27 DIAGNOSIS — Z981 Arthrodesis status: Secondary | ICD-10-CM

## 2015-12-27 DIAGNOSIS — M961 Postlaminectomy syndrome, not elsewhere classified: Secondary | ICD-10-CM | POA: Diagnosis not present

## 2015-12-27 DIAGNOSIS — M1990 Unspecified osteoarthritis, unspecified site: Secondary | ICD-10-CM | POA: Diagnosis not present

## 2015-12-27 DIAGNOSIS — T84226A Displacement of internal fixation device of vertebrae, initial encounter: Secondary | ICD-10-CM | POA: Diagnosis not present

## 2015-12-27 SURGERY — POSTERIOR LUMBAR FUSION 1 WITH HARDWARE REMOVAL
Anesthesia: General | Site: Back

## 2015-12-27 MED ORDER — DIPHENHYDRAMINE HCL 50 MG/ML IJ SOLN
INTRAMUSCULAR | Status: AC
  Filled 2015-12-27: qty 1

## 2015-12-27 MED ORDER — PROPOFOL 10 MG/ML IV BOLUS
INTRAVENOUS | Status: AC
  Filled 2015-12-27: qty 40

## 2015-12-27 MED ORDER — FENTANYL CITRATE (PF) 250 MCG/5ML IJ SOLN
INTRAMUSCULAR | Status: AC
  Filled 2015-12-27: qty 5

## 2015-12-27 MED ORDER — SODIUM CHLORIDE 0.9 % IJ SOLN
INTRAMUSCULAR | Status: AC
  Filled 2015-12-27: qty 10

## 2015-12-27 MED ORDER — EPHEDRINE SULFATE 50 MG/ML IJ SOLN
INTRAMUSCULAR | Status: AC
  Filled 2015-12-27: qty 1

## 2015-12-27 MED ORDER — DEXAMETHASONE SODIUM PHOSPHATE 10 MG/ML IJ SOLN: 10.0000 mg | INTRAMUSCULAR | Status: DC

## 2015-12-27 MED ORDER — MORPHINE SULFATE (PF) 2 MG/ML IV SOLN
1.0000 mg | INTRAVENOUS | Status: DC | PRN
  Administered 2015-12-27: 4 mg via INTRAVENOUS
  Administered 2015-12-27: 2 mg via INTRAVENOUS
  Administered 2015-12-28: 4 mg via INTRAVENOUS
  Filled 2015-12-27: qty 1
  Filled 2015-12-27 (×2): qty 2

## 2015-12-27 MED ORDER — METHOCARBAMOL 1000 MG/10ML IJ SOLN
500.0000 mg | Freq: Four times a day (QID) | INTRAVENOUS | Status: DC | PRN
  Filled 2015-12-27: qty 5

## 2015-12-27 MED ORDER — VANCOMYCIN HCL 1000 MG IV SOLR
INTRAVENOUS | Status: AC
  Filled 2015-12-27: qty 1000

## 2015-12-27 MED ORDER — BUPIVACAINE HCL (PF) 0.25 % IJ SOLN
INTRAMUSCULAR | Status: DC | PRN
  Administered 2015-12-27: 4 mL

## 2015-12-27 MED ORDER — MIDAZOLAM HCL 2 MG/2ML IJ SOLN
INTRAMUSCULAR | Status: AC
  Filled 2015-12-27: qty 2

## 2015-12-27 MED ORDER — PHENOL 1.4 % MT LIQD
1.0000 | OROMUCOSAL | Status: DC | PRN
Start: 2015-12-27 — End: 2015-12-28

## 2015-12-27 MED ORDER — PHENYLEPHRINE 40 MCG/ML (10ML) SYRINGE FOR IV PUSH (FOR BLOOD PRESSURE SUPPORT)
PREFILLED_SYRINGE | INTRAVENOUS | Status: AC
  Filled 2015-12-27: qty 10

## 2015-12-27 MED ORDER — 0.9 % SODIUM CHLORIDE (POUR BTL) OPTIME
TOPICAL | Status: DC | PRN
  Administered 2015-12-27: 1000 mL

## 2015-12-27 MED ORDER — DEXAMETHASONE SODIUM PHOSPHATE 4 MG/ML IJ SOLN
INTRAMUSCULAR | Status: AC
  Filled 2015-12-27: qty 1

## 2015-12-27 MED ORDER — MIDAZOLAM HCL 5 MG/5ML IJ SOLN
INTRAMUSCULAR | Status: DC | PRN
  Administered 2015-12-27: 2 mg via INTRAVENOUS

## 2015-12-27 MED ORDER — VANCOMYCIN HCL 1000 MG IV SOLR
INTRAVENOUS | Status: DC | PRN
  Administered 2015-12-27: 1000 mg via TOPICAL

## 2015-12-27 MED ORDER — HYDROMORPHONE HCL 1 MG/ML IJ SOLN
INTRAMUSCULAR | Status: AC
  Filled 2015-12-27: qty 1

## 2015-12-27 MED ORDER — THROMBIN 5000 UNITS EX SOLR
OROMUCOSAL | Status: DC | PRN
  Administered 2015-12-27: 09:00:00 via TOPICAL

## 2015-12-27 MED ORDER — PROMETHAZINE HCL 25 MG/ML IJ SOLN: 6.2500 mg | INTRAMUSCULAR | Status: DC | PRN

## 2015-12-27 MED ORDER — LACTATED RINGERS IV SOLN
INTRAVENOUS | Status: DC | PRN
  Administered 2015-12-27 (×2): via INTRAVENOUS

## 2015-12-27 MED ORDER — GLYCOPYRROLATE 0.2 MG/ML IJ SOLN
INTRAMUSCULAR | Status: AC
  Filled 2015-12-27: qty 2

## 2015-12-27 MED ORDER — DEXAMETHASONE SODIUM PHOSPHATE 10 MG/ML IJ SOLN
INTRAMUSCULAR | Status: AC
  Filled 2015-12-27: qty 1

## 2015-12-27 MED ORDER — METHOCARBAMOL 500 MG PO TABS
500.0000 mg | ORAL_TABLET | Freq: Four times a day (QID) | ORAL | Status: DC | PRN
  Administered 2015-12-27: 500 mg via ORAL
  Filled 2015-12-27 (×2): qty 1

## 2015-12-27 MED ORDER — SODIUM CHLORIDE 0.9 % IJ SOLN: 3.0000 mL | INTRAMUSCULAR | Status: DC | PRN

## 2015-12-27 MED ORDER — MORPHINE SULFATE 15 MG PO TABS
15.0000 mg | ORAL_TABLET | ORAL | Status: DC | PRN
  Administered 2015-12-27 – 2015-12-28 (×3): 15 mg via ORAL
  Filled 2015-12-27 (×3): qty 1

## 2015-12-27 MED ORDER — OXYCODONE HCL 5 MG PO TABS: 5.0000 mg | ORAL_TABLET | Freq: Once | ORAL | Status: DC | PRN

## 2015-12-27 MED ORDER — ACETAMINOPHEN 325 MG PO TABS: 650.0000 mg | ORAL_TABLET | ORAL | Status: DC | PRN

## 2015-12-27 MED ORDER — FENTANYL CITRATE (PF) 100 MCG/2ML IJ SOLN
INTRAMUSCULAR | Status: DC | PRN
  Administered 2015-12-27 (×2): 50 ug via INTRAVENOUS
  Administered 2015-12-27: 100 ug via INTRAVENOUS

## 2015-12-27 MED ORDER — ONDANSETRON HCL 4 MG/2ML IJ SOLN
INTRAMUSCULAR | Status: DC | PRN
  Administered 2015-12-27: 4 mg via INTRAVENOUS

## 2015-12-27 MED ORDER — ARTIFICIAL TEARS OP OINT
TOPICAL_OINTMENT | OPHTHALMIC | Status: DC | PRN
  Administered 2015-12-27: 1 via OPHTHALMIC

## 2015-12-27 MED ORDER — ONDANSETRON HCL 4 MG/2ML IJ SOLN
4.0000 mg | INTRAMUSCULAR | Status: DC | PRN
  Administered 2015-12-27: 4 mg via INTRAVENOUS
  Filled 2015-12-27: qty 2

## 2015-12-27 MED ORDER — PROPOFOL 10 MG/ML IV BOLUS
INTRAVENOUS | Status: DC | PRN
  Administered 2015-12-27: 250 mg via INTRAVENOUS

## 2015-12-27 MED ORDER — THROMBIN 20000 UNITS EX SOLR
CUTANEOUS | Status: DC | PRN
  Administered 2015-12-27: 09:00:00 via TOPICAL

## 2015-12-27 MED ORDER — HYDROMORPHONE HCL 1 MG/ML IJ SOLN
0.2500 mg | INTRAMUSCULAR | Status: DC | PRN
  Administered 2015-12-27 (×3): 0.5 mg via INTRAVENOUS

## 2015-12-27 MED ORDER — MENTHOL 3 MG MT LOZG: 1.0000 | LOZENGE | OROMUCOSAL | Status: DC | PRN

## 2015-12-27 MED ORDER — ROCURONIUM BROMIDE 50 MG/5ML IV SOLN
INTRAVENOUS | Status: AC
  Filled 2015-12-27: qty 1

## 2015-12-27 MED ORDER — ONDANSETRON HCL 4 MG/2ML IJ SOLN
INTRAMUSCULAR | Status: AC
  Filled 2015-12-27: qty 2

## 2015-12-27 MED ORDER — POTASSIUM CHLORIDE IN NACL 20-0.9 MEQ/L-% IV SOLN
INTRAVENOUS | Status: DC
  Filled 2015-12-27 (×4): qty 1000

## 2015-12-27 MED ORDER — ACETAMINOPHEN 650 MG RE SUPP: 650.0000 mg | RECTAL | Status: DC | PRN

## 2015-12-27 MED ORDER — LIDOCAINE HCL (CARDIAC) 20 MG/ML IV SOLN
INTRAVENOUS | Status: DC | PRN
  Administered 2015-12-27: 100 mg via INTRAVENOUS

## 2015-12-27 MED ORDER — GLYCOPYRROLATE 0.2 MG/ML IJ SOLN
INTRAMUSCULAR | Status: DC | PRN
  Administered 2015-12-27: 0.4 mg via INTRAVENOUS

## 2015-12-27 MED ORDER — GLYCOPYRROLATE 0.2 MG/ML IJ SOLN
INTRAMUSCULAR | Status: AC
  Filled 2015-12-27: qty 1

## 2015-12-27 MED ORDER — ARTIFICIAL TEARS OP OINT
TOPICAL_OINTMENT | OPHTHALMIC | Status: AC
  Filled 2015-12-27: qty 3.5

## 2015-12-27 MED ORDER — NEOSTIGMINE METHYLSULFATE 10 MG/10ML IV SOLN
INTRAVENOUS | Status: DC | PRN
  Administered 2015-12-27: 3 mg via INTRAVENOUS

## 2015-12-27 MED ORDER — LIDOCAINE HCL (CARDIAC) 20 MG/ML IV SOLN
INTRAVENOUS | Status: AC
  Filled 2015-12-27: qty 5

## 2015-12-27 MED ORDER — SUCCINYLCHOLINE CHLORIDE 20 MG/ML IJ SOLN
INTRAMUSCULAR | Status: AC
  Filled 2015-12-27: qty 1

## 2015-12-27 MED ORDER — DIPHENHYDRAMINE HCL 50 MG/ML IJ SOLN
INTRAMUSCULAR | Status: DC | PRN
  Administered 2015-12-27: 12.5 mg via INTRAVENOUS

## 2015-12-27 MED ORDER — CEFAZOLIN SODIUM 1-5 GM-% IV SOLN
1.0000 g | Freq: Three times a day (TID) | INTRAVENOUS | Status: AC
  Administered 2015-12-27 (×2): 1 g via INTRAVENOUS
  Filled 2015-12-27 (×2): qty 50

## 2015-12-27 MED ORDER — OXYCODONE HCL 5 MG/5ML PO SOLN: 5.0000 mg | Freq: Once | ORAL | Status: DC | PRN

## 2015-12-27 MED ORDER — TIZANIDINE HCL 4 MG PO TABS
4.0000 mg | ORAL_TABLET | Freq: Four times a day (QID) | ORAL | Status: DC | PRN
  Administered 2015-12-28: 4 mg via ORAL
  Filled 2015-12-27 (×2): qty 1

## 2015-12-27 MED ORDER — SODIUM CHLORIDE 0.9 % IR SOLN
Status: DC | PRN
  Administered 2015-12-27: 09:00:00

## 2015-12-27 MED ORDER — SODIUM CHLORIDE 0.9 % IJ SOLN
3.0000 mL | Freq: Two times a day (BID) | INTRAMUSCULAR | Status: DC
  Administered 2015-12-27 (×2): 3 mL via INTRAVENOUS

## 2015-12-27 MED ORDER — ROCURONIUM BROMIDE 100 MG/10ML IV SOLN
INTRAVENOUS | Status: DC | PRN
  Administered 2015-12-27: 50 mg via INTRAVENOUS

## 2015-12-27 SURGICAL SUPPLY — 52 items
APL SKNCLS STERI-STRIP NONHPOA (GAUZE/BANDAGES/DRESSINGS) ×1
BAG DECANTER FOR FLEXI CONT (MISCELLANEOUS) ×3 IMPLANT
BENZOIN TINCTURE PRP APPL 2/3 (GAUZE/BANDAGES/DRESSINGS) ×3 IMPLANT
BLADE CLIPPER SURG (BLADE) IMPLANT
BONE MATRIX OSTEOCEL PRO MED (Bone Implant) ×2 IMPLANT
BUR MATCHSTICK NEURO 3.0 LAGG (BURR) ×3 IMPLANT
CANISTER SUCT 3000ML PPV (MISCELLANEOUS) ×3 IMPLANT
CLOSURE WOUND 1/2 X4 (GAUZE/BANDAGES/DRESSINGS) ×1
CONT SPEC 4OZ CLIKSEAL STRL BL (MISCELLANEOUS) ×3 IMPLANT
COVER BACK TABLE 60X90IN (DRAPES) ×3 IMPLANT
DRAPE C-ARM 42X72 X-RAY (DRAPES) ×6 IMPLANT
DRAPE LAPAROTOMY 100X72X124 (DRAPES) ×3 IMPLANT
DRAPE POUCH INSTRU U-SHP 10X18 (DRAPES) ×3 IMPLANT
DRAPE SURG 17X23 STRL (DRAPES) ×3 IMPLANT
DRSG OPSITE POSTOP 4X6 (GAUZE/BANDAGES/DRESSINGS) ×2 IMPLANT
DURAPREP 26ML APPLICATOR (WOUND CARE) ×3 IMPLANT
ELECT REM PT RETURN 9FT ADLT (ELECTROSURGICAL) ×3
ELECTRODE REM PT RTRN 9FT ADLT (ELECTROSURGICAL) ×1 IMPLANT
EVACUATOR 1/8 PVC DRAIN (DRAIN) ×3 IMPLANT
GAUZE SPONGE 4X4 16PLY XRAY LF (GAUZE/BANDAGES/DRESSINGS) IMPLANT
GLOVE BIO SURGEON STRL SZ8 (GLOVE) ×6 IMPLANT
GOWN STRL REUS W/ TWL LRG LVL3 (GOWN DISPOSABLE) IMPLANT
GOWN STRL REUS W/ TWL XL LVL3 (GOWN DISPOSABLE) ×2 IMPLANT
GOWN STRL REUS W/TWL 2XL LVL3 (GOWN DISPOSABLE) IMPLANT
GOWN STRL REUS W/TWL LRG LVL3 (GOWN DISPOSABLE)
GOWN STRL REUS W/TWL XL LVL3 (GOWN DISPOSABLE) ×6
HEMOSTAT POWDER KIT SURGIFOAM (HEMOSTASIS) IMPLANT
KIT BASIN OR (CUSTOM PROCEDURE TRAY) ×3 IMPLANT
KIT ROOM TURNOVER OR (KITS) ×3 IMPLANT
LIQUID BAND (GAUZE/BANDAGES/DRESSINGS) ×2 IMPLANT
MILL MEDIUM DISP (BLADE) ×2 IMPLANT
NDL ASP BONE MRW 8GX15 (NEEDLE) IMPLANT
NDL HYPO 25X1 1.5 SAFETY (NEEDLE) ×1 IMPLANT
NEEDLE ASP BONE MRW 8GX15 (NEEDLE) ×3 IMPLANT
NEEDLE HYPO 25X1 1.5 SAFETY (NEEDLE) ×3 IMPLANT
NS IRRIG 1000ML POUR BTL (IV SOLUTION) ×3 IMPLANT
PACK LAMINECTOMY NEURO (CUSTOM PROCEDURE TRAY) ×3 IMPLANT
PAD ARMBOARD 7.5X6 YLW CONV (MISCELLANEOUS) ×9 IMPLANT
PUTTY BONE ATTRAX 10CC STRIP (Putty) ×2 IMPLANT
SPONGE LAP 4X18 X RAY DECT (DISPOSABLE) IMPLANT
SPONGE SURGIFOAM ABS GEL 100 (HEMOSTASIS) ×3 IMPLANT
STRIP CLOSURE SKIN 1/2X4 (GAUZE/BANDAGES/DRESSINGS) ×3 IMPLANT
SUT VIC AB 0 CT1 18XCR BRD8 (SUTURE) ×1 IMPLANT
SUT VIC AB 0 CT1 8-18 (SUTURE) ×6
SUT VIC AB 2-0 CP2 18 (SUTURE) ×5 IMPLANT
SUT VIC AB 3-0 SH 8-18 (SUTURE) ×6 IMPLANT
SYR 20CC LL (SYRINGE) ×2 IMPLANT
TOWEL OR 17X24 6PK STRL BLUE (TOWEL DISPOSABLE) ×3 IMPLANT
TOWEL OR 17X26 10 PK STRL BLUE (TOWEL DISPOSABLE) ×3 IMPLANT
TRAP SPECIMEN MUCOUS 40CC (MISCELLANEOUS) IMPLANT
TRAY FOLEY W/METER SILVER 16FR (SET/KITS/TRAYS/PACK) ×2 IMPLANT
WATER STERILE IRR 1000ML POUR (IV SOLUTION) ×3 IMPLANT

## 2015-12-27 NOTE — H&P (Signed)
Subjective: Patient is a 45 y.o. male admitted for back pain. Onset of symptoms was several months ago, unchanged since that time.  The pain is rated severe, and is located at the across the lower back. The pain is described as aching and occurs all day. The symptoms have been progressive. Symptoms are exacerbated by exercise. MRI or CT showed pseudoarthrosis L5-S1.   Past Medical History  Diagnosis Date  . Headache(784.0)   . Arthritis     back    Past Surgical History  Procedure Laterality Date  . Knee surgery Left   . Maximum access (mas)posterior lumbar interbody fusion (plif) 2 level N/A 03/15/2014    Procedure: FOR MAXIMUM ACCESS (MAS) POSTERIOR LUMBAR INTERBODY FUSION (PLIF) 2 LEVEL four/five five/sacral one;  Surgeon: Tia Alert, MD;  Location: [facility];  Service: Neurosurgery;  Laterality: N/A;  . Laminectomy N/A 03/22/2014    Procedure: irrigation and debriedment of lumbar wound;  Surgeon: Karn Cassis, MD;  Location: [facility];  Service: Neurosurgery;  Laterality: N/A;    Prior to Admission medications   Medication Sig Start Date End Date Taking? Authorizing Provider  morphine (MSIR) 15 MG tablet Take 15 mg by mouth every 4 (four) hours as needed for moderate pain or severe pain.   Yes Historical Provider, MD  tiZANidine (ZANAFLEX) 4 MG tablet Take 4 mg by mouth every 6 (six) hours as needed for muscle spasms.   Yes Historical Provider, MD  diazepam (VALIUM) 5 MG tablet Take 1-2 tablets (5-10 mg total) by mouth every 6 (six) hours as needed for muscle spasms. Patient not taking: Reported on 12/16/2015 03/17/14   Julio Sicks, MD   Allergies  Allergen Reactions  . Codeine Itching and Nausea And Vomiting  . Cortisone   . Dexamethasone   . Ketorolac Tromethamine   . Nsaids   . Sulfa Antibiotics     Social History  Substance Use Topics  . Smoking status: Current Every Day Smoker -- 1.00 packs/day for 26 years  . Smokeless tobacco: Never Used  . Alcohol Use: Yes    Comment: social    History reviewed. No pertinent family history.   Review of Systems  Positive ROS: neg  All other systems have been reviewed and were otherwise negative with the exception of those mentioned in the HPI and as above.  Objective: Vital signs in last 24 hours: Temp:  [98 F (36.7 C)] 98 F (36.7 C) (01/20 1610) Pulse Rate:  [85] 85 (01/20 0638) Resp:  [20] 20 (01/20 0638) BP: (134)/(92) 134/92 mmHg (01/20 0638) SpO2:  [96 %] 96 % (01/20 9604)  General Appearance: Alert, cooperative, no distress, appears stated age Head: Normocephalic, without obvious abnormality, atraumatic Eyes: PERRL, conjunctiva/corneas clear, EOM's intact    Neck: Supple, symmetrical, trachea midline Back: Symmetric, no curvature, ROM normal, no CVA tenderness Lungs:  respirations unlabored Heart: Regular rate and rhythm Abdomen: Soft, non-tender Extremities: Extremities normal, atraumatic, no cyanosis or edema Pulses: 2+ and symmetric all extremities Skin: Skin color, texture, turgor normal, no rashes or lesions  NEUROLOGIC:   Mental status: Alert and oriented x4,  no aphasia, good attention span, fund of knowledge, and memory Motor Exam - grossly normal Sensory Exam - grossly normal Reflexes: 1+ Coordination - grossly normal Gait - grossly normal Balance - grossly normal Cranial Nerves: I: smell Not tested  II: visual acuity  OS: nl    OD: nl  II: visual fields Full to confrontation  II: pupils Equal, round,  reactive to light  III,VII: ptosis None  III,IV,VI: extraocular muscles  Full ROM  V: mastication Normal  V: facial light touch sensation  Normal  V,VII: corneal reflex  Present  VII: facial muscle function - upper  Normal  VII: facial muscle function - lower Normal  VIII: hearing Not tested  IX: soft palate elevation  Normal  IX,X: gag reflex Present  XI: trapezius strength  5/5  XI: sternocleidomastoid strength 5/5  XI: neck flexion strength  5/5  XII: tongue  strength  Normal    Data Review Lab Results  Component Value Date   WBC 13.1* 12/18/2015   HGB 16.8 12/18/2015   HCT 49.0 12/18/2015   MCV 93.2 12/18/2015   PLT 193 12/18/2015   Lab Results  Component Value Date   NA 139 12/18/2015   K 4.0 12/18/2015   CL 106 12/18/2015   CO2 24 12/18/2015   BUN 10 12/18/2015   CREATININE 1.16 12/18/2015   GLUCOSE 111* 12/18/2015   Lab Results  Component Value Date   INR 1.05 12/18/2015    Assessment/Plan: Patient admitted for removal of hardware, PLF L5-S1. Patient has failed a reasonable attempt at conservative therapy.  I explained the condition and procedure to the patient and answered any questions.  Patient wishes to proceed with procedure as planned. Understands risks/ benefits and typical outcomes of procedure.   Khadar Monger S 12/27/2015 7:14 AM

## 2015-12-27 NOTE — OR Nursing (Signed)
Removed implants where sent to workroom to be cleaned and then to be given to Dr. Yetta Barre. Emelda Brothers, RN

## 2015-12-27 NOTE — Anesthesia Procedure Notes (Signed)
Procedure Name: Intubation Date/Time: 12/27/2015 8:25 AM Performed by: Fabian November Pre-anesthesia Checklist: Patient identified, Patient being monitored, Timeout performed, Emergency Drugs available and Suction available Patient Re-evaluated:Patient Re-evaluated prior to inductionOxygen Delivery Method: Circle System Utilized Preoxygenation: Pre-oxygenation with 100% oxygen Intubation Type: IV induction Ventilation: Mask ventilation without difficulty Laryngoscope Size: Miller and 3 Grade View: Grade I Tube type: Oral Tube size: 8.0 mm Number of attempts: 1 Airway Equipment and Method: Stylet Placement Confirmation: ETT inserted through vocal cords under direct vision,  positive ETCO2 and breath sounds checked- equal and bilateral Secured at: 23 cm Tube secured with: Tape Dental Injury: Teeth and Oropharynx as per pre-operative assessment

## 2015-12-27 NOTE — Transfer of Care (Signed)
Immediate Anesthesia Transfer of Care Note  Patient: Keith Dawson  Procedure(s) Performed: Procedure(s): Posterior Lateral Fusion - Lumbar five-Sacral one, removal of hardware Lumbar four-Sacral one (N/A)  Patient Location: PACU  Anesthesia Type:General  Level of Consciousness: awake, alert , oriented and patient cooperative  Airway & Oxygen Therapy: Patient Spontanous Breathing and Patient connected to nasal cannula oxygen  Post-op Assessment: Report given to RN, Post -op Vital signs reviewed and stable and Patient moving all extremities X 4  Post vital signs: Reviewed and stable  Last Vitals:  Filed Vitals:   12/27/15 0638  BP: 134/92  Pulse: 85  Temp: 36.7 C  Resp: 20    Complications: No apparent anesthesia complications

## 2015-12-27 NOTE — Anesthesia Postprocedure Evaluation (Signed)
Anesthesia Post Note  Patient: Keith Dawson  Procedure(s) Performed: Procedure(s) (LRB): Posterior Lateral Fusion - Lumbar five-Sacral one, removal of hardware Lumbar four-Sacral one (N/A)  Patient location during evaluation: PACU Anesthesia Type: General Level of consciousness: awake and alert Pain management: pain level controlled Vital Signs Assessment: post-procedure vital signs reviewed and stable Respiratory status: spontaneous breathing Cardiovascular status: blood pressure returned to baseline Anesthetic complications: no    Last Vitals:  Filed Vitals:   12/27/15 1115 12/27/15 1140  BP: 112/85 123/87  Pulse: 74 85  Temp: 36.5 C 36.6 C  Resp: 10 16    Last Pain:  Filed Vitals:   12/27/15 1152  PainSc: 4                  Kennieth Rad

## 2015-12-27 NOTE — Op Note (Signed)
12/27/2015  9:40 AM  PATIENT:  Keith Dawson  45 y.o. male  PRE-OPERATIVE DIAGNOSIS:  Pseudoarthrosis L5-S1, painful loosened hardware, back pain  POST-OPERATIVE DIAGNOSIS:  Same  PROCEDURE:  1. Expiration of fusion L5-S1 to confirm pseudoarthrosis and loosening of hardware, 2. Removal of segmental fixation L4-S1, 3. Posterior lateral fusion L5-S1 utilizing morcellized allograft soaked with bone marrow aspirate obtained to the right iliac crest through a separate fascial incision  SURGEON:  Marikay Alar, MD  ASSISTANTS: None  ANESTHESIA:   General  EBL: 25 ml  Total I/O In: 1200 [I.V.:1200] Out: 125 [Urine:100; Blood:25]  BLOOD ADMINISTERED:none  DRAINS: None   SPECIMEN:  No Specimen  INDICATION FOR PROCEDURE: This patient underwent a 2 level lumbar fusion 2 years ago. He presented with chronic back pain. CT scan suggested a pseudoarthrosis at L5-S1 with some lucency around the S1 pedicle screws. Medical management will quite a long time. I recommended lumbar exploration and removal of hardware with posterior lateral fusion L5-S1. Patient understood the risks, benefits, and alternatives and potential outcomes and wished to proceed.  PROCEDURE DETAILS: The patient was taken to the operating room and after induction of adequate generalized endotracheal anesthesia he was rolled into the prone position on chest rolls. His lumbar region was cleaned and then prepped with DuraPrep and then draped in the usual sterile fashion. 5 mL of local anesthesia was injected and his old incision was opened. I dissected down and identified the spinous process above and then dissected down and found the screw heads from L4-S1. I then dissected a plane superior to the fascia on the right and identified the right iliac crest and open the fascia over the iliac crest. A used to Jamshidi needle and a 20 mL syringe to remove about 20 mL of bone marrow aspirate which I soaked on morcellized allograft. I then  closed the fascia with 0 Vicryl. I then placed self-retaining retractors and removed the locking caps from the screws, removed the rods and then removed each screw in succession. The L5-S1 fusion was explored by pulling on the screw heads. The S1 screws were loose. I then dissected out over the transverse processes of L5 and the sacral alar and decorticated these and placed a mixture of morcellized allograft soaked in bone marrow aspirate performed posterior lateral arthrodesis L5-S1 bilaterally. The wound was then irrigated with copious amounts of bacitracin containing saline solution. All bleeding points were identified and controlled with unipolar cautery. The muscle and the fascia were closed with 0 Vicryl. The subcutaneous tissue was closed with 2-0 Vicryl. The subcuticular tissue was closed with 3-0 Vicryl. The skin was closed with Dermabond benzoin and Steri-Strips. A sterile dressing was applied. The patient was awakened from general anesthesia and transported to the recovery room in stable condition. At the end of the procedure all sponge needle and instrument counts were correct.  PLAN OF CARE: Admit for overnight observation  PATIENT DISPOSITION:  PACU - hemodynamically stable.   Delay start of Pharmacological VTE agent (>24hrs) due to surgical blood loss or risk of bleeding:  yes

## 2015-12-28 DIAGNOSIS — M960 Pseudarthrosis after fusion or arthrodesis: Secondary | ICD-10-CM | POA: Diagnosis not present

## 2015-12-28 DIAGNOSIS — M1990 Unspecified osteoarthritis, unspecified site: Secondary | ICD-10-CM | POA: Diagnosis not present

## 2015-12-28 DIAGNOSIS — F17200 Nicotine dependence, unspecified, uncomplicated: Secondary | ICD-10-CM | POA: Diagnosis not present

## 2015-12-28 MED ORDER — DIAZEPAM 5 MG PO TABS: 5.0000 mg | ORAL_TABLET | Freq: Four times a day (QID) | ORAL | Status: AC | PRN

## 2015-12-28 MED ORDER — MORPHINE SULFATE 15 MG PO TABS: 15.0000 mg | ORAL_TABLET | ORAL | Status: AC | PRN

## 2015-12-28 NOTE — Progress Notes (Signed)
Patient alert and oriented, mae's well, voiding adequate amount of urine, swallowing without difficulty, c/o pain and medication given prior to discharged. Patient discharged home with family. Script and discharged instructions given to patient. Patient and family stated understanding of d/c instructions given and has an appointment with MD.  

## 2015-12-28 NOTE — Discharge Summary (Signed)
Physician Discharge Summary  Patient ID: Keith Dawson MRN: [id number] DOB/AGE: February 25, 1971 45 y.o.  Admit date: 12/27/2015 Discharge date: 12/28/2015  Admission Diagnoses: pseudoarthrosis   Discharge Diagnoses: same   Discharged Condition: good  Hospital Course: The patient was admitted on 12/27/2015 and taken to the operating room where the patient underwent removal of hardware and PLF L5-S1. The patient tolerated the procedure well and was taken to the recovery room and then to the floor in stable condition. The hospital course was routine. There were no complications. The wound remained clean dry and intact. Pt had appropriate back soreness. No complaints of leg pain or new N/T/W. The patient remained afebrile with stable vital signs, and tolerated a regular diet. The patient continued to increase activities, and pain was well controlled with oral pain medications.   Consults: None  Significant Diagnostic Studies:  Results for orders placed or performed during the hospital encounter of 12/18/15  Surgical pcr screen  Result Value Ref Range   MRSA, PCR NEGATIVE NEGATIVE   Staphylococcus aureus NEGATIVE NEGATIVE  Basic metabolic panel  Result Value Ref Range   Sodium 139 135 - 145 mmol/L   Potassium 4.0 3.5 - 5.1 mmol/L   Chloride 106 101 - 111 mmol/L   CO2 24 22 - 32 mmol/L   Glucose, Bld 111 (H) 65 - 99 mg/dL   BUN 10 6 - 20 mg/dL   Creatinine, Ser 4.09 0.61 - 1.24 mg/dL   Calcium 9.4 8.9 - 81.1 mg/dL   GFR calc non Af Amer >60 >60 mL/min   GFR calc Af Amer >60 >60 mL/min   Anion gap 9 5 - 15  CBC WITH DIFFERENTIAL  Result Value Ref Range   WBC 13.1 (H) 4.0 - 10.5 K/uL   RBC 5.26 4.22 - 5.81 MIL/uL   Hemoglobin 16.8 13.0 - 17.0 g/dL   HCT 91.4 78.2 - 95.6 %   MCV 93.2 78.0 - 100.0 fL   MCH 31.9 26.0 - 34.0 pg   MCHC 34.3 30.0 - 36.0 g/dL   RDW 21.3 08.6 - 57.8 %   Platelets 193 150 - 400 K/uL   Neutrophils Relative % 73 %   Neutro Abs 9.6 (H) 1.7 - 7.7 K/uL   Lymphocytes Relative 19 %   Lymphs Abs 2.4 0.7 - 4.0 K/uL   Monocytes Relative 7 %   Monocytes Absolute 0.9 0.1 - 1.0 K/uL   Eosinophils Relative 1 %   Eosinophils Absolute 0.2 0.0 - 0.7 K/uL   Basophils Relative 0 %   Basophils Absolute 0.0 0.0 - 0.1 K/uL  Protime-INR  Result Value Ref Range   Prothrombin Time 13.9 11.6 - 15.2 seconds   INR 1.05 0.00 - 1.49  Type and screen All Cardiac and thoracic surgeries, spinal fusions, myomectomies, craniotomies, colon & liver resections, total joint revisions, same day c-section with placenta previa or accreta.  Result Value Ref Range   ABO/RH(D) AB POS    Antibody Screen NEG    Sample Expiration 01/01/2016    Extend sample reason NO TRANSFUSIONS OR PREGNANCY IN THE PAST 3 MONTHS     Chest 2 View  12/18/2015  CLINICAL DATA:  Preoperative exam prior to lumbar surgery ; long history of tobacco use, no active cardiopulmonary symptoms. EXAM: CHEST  2 VIEW COMPARISON:  Portable chest x-ray of March 27, 2014 FINDINGS: The lungs are adequately inflated and clear. The heart and pulmonary vascularity are normal. The mediastinum is normal in width. There is no pleural effusion. The  bony thorax exhibits no acute abnormality. IMPRESSION: There is no active cardiopulmonary disease. Electronically Signed   By: Ruweyda Macknight  [place] M.D.   On: 12/18/2015 10:27    Antibiotics:  Anti-infectives    Start     Dose/Rate Route Frequency Ordered Stop   12/27/15 1630  ceFAZolin (ANCEF) IVPB 1 g/50 mL premix     1 g 100 mL/hr over 30 Minutes Intravenous Every 8 hours 12/27/15 1206 12/28/15 0001   12/27/15 0914  vancomycin (VANCOCIN) powder  Status:  Discontinued       As needed 12/27/15 0914 12/27/15 0934   12/27/15 0857  bacitracin 50,000 Units in sodium chloride irrigation 0.9 % 500 mL irrigation  Status:  Discontinued       As needed 12/27/15 0858 12/27/15 0934   12/27/15 0843  vancomycin (VANCOCIN) 1000 MG powder    Comments:  Emelda Brothers   : cabinet override       12/27/15 0843 12/27/15 2059   12/27/15 0600  ceFAZolin (ANCEF) IVPB 2 g/50 mL premix     2 g 100 mL/hr over 30 Minutes Intravenous On call to O.R. 12/26/15 1356 12/27/15 0826      Discharge Exam: Blood pressure 101/68, pulse 85, temperature 98.2 F (36.8 C), temperature source Oral, resp. rate 20, SpO2 97 %. Neurologic: Grossly normal Dressing dry  Discharge Medications:     Medication List    TAKE these medications        diazepam 5 MG tablet  Commonly known as:  VALIUM  Take 1-2 tablets (5-10 mg total) by mouth every 6 (six) hours as needed for muscle spasms.     morphine 15 MG tablet  Commonly known as:  MSIR  Take 1 tablet (15 mg total) by mouth every 4 (four) hours as needed for moderate pain or severe pain.     tiZANidine 4 MG tablet  Commonly known as:  ZANAFLEX  Take 4 mg by mouth every 6 (six) hours as needed for muscle spasms.        Disposition: home   Final Dx: PLF L5-S1, removal of painful hardware      Discharge Instructions     Remove dressing in 72 hours    Complete by:  As directed      Call MD for:  difficulty breathing, headache or visual disturbances    Complete by:  As directed      Call MD for:  persistant nausea and vomiting    Complete by:  As directed      Call MD for:  redness, tenderness, or signs of infection (pain, swelling, redness, odor or green/yellow discharge around incision site)    Complete by:  As directed      Call MD for:  severe uncontrolled pain    Complete by:  As directed      Call MD for:  temperature >100.4    Complete by:  As directed      Diet - low sodium heart healthy    Complete by:  As directed      Discharge instructions    Complete by:  As directed   May shower, no bending or twisting or heavy lifting     Increase activity slowly    Complete by:  As directed               Signed: Bravlio Luca S 12/28/2015, 9:11 AM

## 2016-01-02 ENCOUNTER — Encounter (HOSPITAL_COMMUNITY): Payer: Self-pay | Admitting: Emergency Medicine

## 2016-01-02 DIAGNOSIS — G8918 Other acute postprocedural pain: Secondary | ICD-10-CM | POA: Insufficient documentation

## 2016-01-02 DIAGNOSIS — F17200 Nicotine dependence, unspecified, uncomplicated: Secondary | ICD-10-CM | POA: Diagnosis not present

## 2016-01-02 DIAGNOSIS — Z9889 Other specified postprocedural states: Secondary | ICD-10-CM | POA: Insufficient documentation

## 2016-01-02 DIAGNOSIS — M545 Low back pain: Secondary | ICD-10-CM | POA: Insufficient documentation

## 2016-01-02 NOTE — ED Notes (Signed)
Pt had hardware removed from lumbar spine on Friday. C.o severe pain despite taking morphine and diazepam. Denies new weakness/numbness. No loss of bowl or urine. Denies fevers

## 2016-01-03 ENCOUNTER — Emergency Department (HOSPITAL_COMMUNITY): Payer: Medicare Other

## 2016-01-03 ENCOUNTER — Emergency Department (HOSPITAL_COMMUNITY)
Admission: EM | Admit: 2016-01-03 | Discharge: 2016-01-03 | Disposition: A | Discharge status: Home / Self Care | Payer: Medicare Other | Attending: Emergency Medicine | Admitting: Emergency Medicine

## 2016-01-03 DIAGNOSIS — M545 Low back pain: Secondary | ICD-10-CM | POA: Diagnosis not present

## 2016-01-03 DIAGNOSIS — G8918 Other acute postprocedural pain: Secondary | ICD-10-CM

## 2016-01-03 LAB — CBC WITH DIFFERENTIAL/PLATELET
Basophils Absolute: 0.1 10*3/uL (ref 0.0–0.1)
Basophils Relative: 1 %
Eosinophils Absolute: 0.2 10*3/uL (ref 0.0–0.7)
Eosinophils Relative: 2 %
HEMATOCRIT: 43.9 % (ref 39.0–52.0)
HEMOGLOBIN: 14.7 g/dL (ref 13.0–17.0)
LYMPHS ABS: 4.4 10*3/uL — AB (ref 0.7–4.0)
LYMPHS PCT: 41 %
MCH: 30.6 pg (ref 26.0–34.0)
MCHC: 33.5 g/dL (ref 30.0–36.0)
MCV: 91.5 fL (ref 78.0–100.0)
MONO ABS: 0.8 10*3/uL (ref 0.1–1.0)
MONOS PCT: 7 %
NEUTROS ABS: 5.3 10*3/uL (ref 1.7–7.7)
NEUTROS PCT: 49 %
Platelets: 284 10*3/uL (ref 150–400)
RBC: 4.8 MIL/uL (ref 4.22–5.81)
RDW: 13.2 % (ref 11.5–15.5)
WBC: 10.7 10*3/uL — ABNORMAL HIGH (ref 4.0–10.5)

## 2016-01-03 LAB — C-REACTIVE PROTEIN: CRP: 3.3 mg/dL — AB (ref <1.0)

## 2016-01-03 LAB — BASIC METABOLIC PANEL
ANION GAP: 11 (ref 5–15)
BUN: 14 mg/dL (ref 6–20)
CALCIUM: 9.1 mg/dL (ref 8.9–10.3)
CHLORIDE: 105 mmol/L (ref 101–111)
CO2: 24 mmol/L (ref 22–32)
CREATININE: 1.03 mg/dL (ref 0.61–1.24)
GFR calc non Af Amer: >60 mL/min (ref >60)
GLUCOSE: 93 mg/dL (ref 65–99)
Potassium: 4 mmol/L (ref 3.5–5.1)
Sodium: 140 mmol/L (ref 135–145)

## 2016-01-03 LAB — SEDIMENTATION RATE: Sed Rate: 42 mm/hr — ABNORMAL HIGH (ref 0–16)

## 2016-01-03 MED ORDER — DIAZEPAM 5 MG/ML IJ SOLN
2.5000 mg | Freq: Once | INTRAMUSCULAR | Status: AC
  Administered 2016-01-03: 2.5 mg via INTRAVENOUS
  Filled 2016-01-03: qty 2

## 2016-01-03 MED ORDER — HYDROMORPHONE HCL 1 MG/ML IJ SOLN
2.0000 mg | Freq: Once | INTRAMUSCULAR | Status: AC
  Administered 2016-01-03: 2 mg via INTRAVENOUS
  Filled 2016-01-03: qty 2

## 2016-01-03 MED ORDER — ONDANSETRON HCL 4 MG/2ML IJ SOLN
4.0000 mg | Freq: Once | INTRAMUSCULAR | Status: AC
  Administered 2016-01-03: 4 mg via INTRAVENOUS
  Filled 2016-01-03: qty 2

## 2016-01-03 NOTE — ED Provider Notes (Signed)
CSN: [id number]     Arrival date & time 01/02/16  2148 History  By signing my name below, I, Freida Busman, attest that this documentation has been prepared under the direction and in the presence of Gilda Crease, MD . Electronically Signed: Freida Busman, Scribe. 01/03/2016. 1:05 AM.    Chief Complaint  Patient presents with  . Back Pain    The history is provided by the patient. No language interpreter was used.   HPI Comments:  Keith Dawson is a 45 y.o. male who presents to the Emergency Department 10 days s/p lumbar back surgery complaining of 9/10 lower back pain that worsened today. He states he has been doing well with his pain since surgery. He has been compliant with  discharge instructions and has taken diazepam and morphine with little relief. He denies shooting pain down his BLE, bowel/bladder incontinence, fever and recent injury/ fall.   Past Medical History  Diagnosis Date  . Headache(784.0)   . Arthritis     back   Past Surgical History  Procedure Laterality Date  . Knee surgery Left   . Maximum access (mas)posterior lumbar interbody fusion (plif) 2 level N/A 03/15/2014    Procedure: FOR MAXIMUM ACCESS (MAS) POSTERIOR LUMBAR INTERBODY FUSION (PLIF) 2 LEVEL four/five five/sacral one;  Surgeon: Tia Alert, MD;  Location: [facility];  Service: Neurosurgery;  Laterality: N/A;  . Laminectomy N/A 03/22/2014    Procedure: irrigation and debriedment of lumbar wound;  Surgeon: Karn Cassis, MD;  Location: [facility];  Service: Neurosurgery;  Laterality: N/A;   No family history on file. Social History  Substance Use Topics  . Smoking status: Current Every Day Smoker -- 1.00 packs/day for 26 years  . Smokeless tobacco: Never Used  . Alcohol Use: Yes     Comment: social    Review of Systems  Constitutional: Negative for fever.  Musculoskeletal: Positive for back pain.  All other systems reviewed and are negative.   Allergies  Codeine; Cortisone;  Dexamethasone; Ketorolac tromethamine; Nsaids; and Sulfa antibiotics  Home Medications   Prior to Admission medications   Medication Sig Start Date End Date Taking? Authorizing Provider  diazepam (VALIUM) 5 MG tablet Take 1-2 tablets (5-10 mg total) by mouth every 6 (six) hours as needed for muscle spasms. 12/28/15   Tia Alert, MD  morphine (MSIR) 15 MG tablet Take 1 tablet (15 mg total) by mouth every 4 (four) hours as needed for moderate pain or severe pain. 12/28/15   Tia Alert, MD  tiZANidine (ZANAFLEX) 4 MG tablet Take 4 mg by mouth every 6 (six) hours as needed for muscle spasms.    Historical Provider, MD   BP 119/87 mmHg  Pulse 93  Temp(Src) 97.8 F (36.6 C)  Resp 16  SpO2 98% Physical Exam  Constitutional: He is oriented to person, place, and time. He appears well-developed and well-nourished. No distress.  HENT:  Head: Normocephalic and atraumatic.  Right Ear: Hearing normal.  Left Ear: Hearing normal.  Nose: Nose normal.  Mouth/Throat: Oropharynx is clear and moist and mucous membranes are normal.  Eyes: Conjunctivae and EOM are normal. Pupils are equal, round, and reactive to light.  Neck: Normal range of motion. Neck supple.  Cardiovascular: Regular rhythm, S1 normal and S2 normal.  Exam reveals no gallop and no friction rub.   No murmur heard. Pulmonary/Chest: Effort normal and breath sounds normal. No respiratory distress. He exhibits no tenderness.  Abdominal: Soft. Normal appearance and  bowel sounds are normal. There is no hepatosplenomegaly. There is no tenderness. There is no rebound, no guarding, no tenderness at McBurney's point and negative Murphy's sign. No hernia.  Musculoskeletal: Normal range of motion. He exhibits tenderness.  diffuse ttp lumbar soft tissue  Neurological: He is alert and oriented to person, place, and time. He has normal strength. No cranial nerve deficit or sensory deficit. Coordination normal. GCS eye subscore is 4. GCS verbal  subscore is 5. GCS motor subscore is 6.  Equal patellar reflexes bilaterally; nml strength and sensation No foot drop  Skin: Skin is warm, dry and intact. No rash noted. No cyanosis.  lumbar midline incision with dry dressing; no erythema, warmth, or drainage  Psychiatric: He has a normal mood and affect. His speech is normal and behavior is normal. Thought content normal.  Nursing note and vitals reviewed.   ED Course  Procedures   DIAGNOSTIC STUDIES:  Oxygen Saturation is 98% on RA, normal by my interpretation.    COORDINATION OF CARE:  12:44 AM Will order pain meds and MRI lumbar spine.   Discussed treatment plan with pt at bedside and pt agreed to plan.  Labs Review Labs Reviewed  CBC WITH DIFFERENTIAL/PLATELET  BASIC METABOLIC PANEL  SEDIMENTATION RATE  C-REACTIVE PROTEIN    Imaging Review No results found. I have personally reviewed and evaluated these images and lab results as part of my medical decision-making.   MDM   Final diagnoses:  Post-op pain    Presents to the emergency for evaluation of back pain. Patient underwent removal of hardware from his lumbar region 1 week ago. Patient reports increasing pain today. Pain is similar to that which he felt prior to surgery. He reports severe pain in his lower back that worsens with movement. Pain is not radicular. He has normal strength, sensation, reflexes. No saddle anesthesia. No foot drop. He does report some mild numbness of the toes on his 1 foot, but this was present preoperatively and unchanged.  Patient is afebrile. Blood work is unremarkable. MRI was performed. There are post operative changes present, no evidence of acute abnormality or infection.  She was treated with analgesia here in the ER. He was counseled to follow-up with his surgeon.  I personally performed the services described in this documentation, which was scribed in my presence. The recorded information has been reviewed and is  accurate.     Gilda Crease, MD 01/03/16 [phone]

## 2016-01-03 NOTE — ED Notes (Signed)
Pt requesting more pain medication; EDP notified.  

## 2016-01-03 NOTE — Discharge Instructions (Signed)
Pain Relief Preoperatively and Postoperatively  If you have questions, problems, or concerns about the pain that you may feel after surgery, let your health care provider know. Patients have the right to assessment and management of pain. Severe pain after surgery--and the fear or anxiety associated with that pain--may cause extreme discomfort that:  · Prevents sleep.  · Decreases the ability to breathe deeply and to cough. This can result in pneumonia or other upper airway infections.  · Causes the heart to beat more quickly and the blood pressure to be higher.  · Increases the risk for constipation and bloating.  · Decreases the ability of wounds to heal.  · May result in depression, increased anxiety, and feelings of helplessness.  Relieving pain before surgery (preoperatively) is also important because it lessens pain that you have after surgery (postoperatively). Patients who receive pain relief both before and after surgery experience greater pain relief than those who receive pain relief only after surgery. Let your health care provider know if you are having uncontrolled pain. This is very important. Pain after surgery is more difficult to manage if it is severe, so receiving prompt and adequate treatment of acute pain is necessary. If you become constipated after taking pain medicine, drink more liquids if you can. Your health care provider may have you take a mild laxative.  PAIN CONTROL METHODS  Your health care providers follow policies and procedures about the management of your pain. These guidelines should be explained to you before surgery. Plans for pain control after surgery must be decided upon by you and your health care provider and put into use with your full understanding and agreement. Do not be afraid to ask questions about the care that you are receiving.  Your health care providers will attempt to control your pain in various ways, and these methods may be used together (multimodal  analgesia). Using this approach has many benefits for you, including being able to eat, move around, and leave the hospital sooner.  As-Needed Pain Control  · You may be given pain medicine through an IV tube or as a pill or liquid that you can swallow. Let your health care provider know when you are having pain, and he or she will give you the pain medicine that is ordered for you.  IV Patient-Controlled Analgesia (PCA) Pump  · You can receive your pain medicine through an IV tube that goes into one of your veins. You can control the amount of pain medicine that you get. The pain medicine is controlled by a pump. When you push the button that is hooked up to this pump, you receive a specific amount of pain medicine. This button should be pushed only by you or by someone who is specifically assigned by you to do so. It is set up to keep you from accidentally giving yourself too much pain medicine. You will be able to start using your pain pump in the recovery room after your surgery. This method can be helpful for most types of surgery.  · Tell your health care provider:    If you are having too much pain.    If you are feeling too sleepy or nauseous.  Continuous Epidural Pain Control  · A thin, soft tube (catheter) is put into your back, outside the outer layer of your spinal cord. Pain medicine flows through the catheter to lessen pain in areas of your body that are below the level of catheter placement. Continuous epidural pain control may work best for you   if you are having surgery on your abdomen, hip area, or legs. The epidural catheter is usually put into your back shortly before surgery. It is left in until you can eat, take medicine by mouth, pass urine, and have a bowel movement.  · Giving pain medicine through the epidural catheter may help you to heal more quickly because you can do these things sooner:    Regain normal bowel and bladder function.    Return to eating.    Get up and walk.  Medicine That  Numbs the Area (Local Anesthetic)  You may be given pain medicine:  · As an injection near the area of the pain (local infiltration).  · As an injection near the nerve that controls the sensation to a specific part of your body (peripheral nerve block).  · In your spine to block pain (spinal block).  · Through a local anesthetic reservoir pump. If your surgeon or anesthesiologist selects this option as a part of your pain control, one or more thin, soft tubes will be inserted into your incision site(s) at the end of surgery. These tubes will be connected to a device that is filled with a non-narcotic pain medicine. This medicine gradually empties into your incision site over the next several days. Usually, after all of the medicine is used, your health care provider will remove the tubes and throw away the device.  Opioids  · Moderate to moderately severe acute pain after surgery may respond to opioids. Opioids are narcotic pain medicine. Opioids are often combined with non-narcotic medicines to improve pain relief, lower the risk of side effects, and reduce the chance of addiction.  · If you follow your health care provider's directions about taking opioids and you do not have a history of substance abuse, your risk of becoming addicted is very small. To prevent addiction, opioids are given for short periods of time in careful doses.  Other Methods of Pain Control  · Steroids.  · Physical therapy.  · Heat and cold therapy.  · Compression, such as wrapping an elastic bandage around the area of the pain.  · Massage.     This information is not intended to replace advice given to you by your health care provider. Make sure you discuss any questions you have with your health care provider.     Document Released: 02/13/2003 Document Revised: 12/14/2014 Document Reviewed: 02/17/2011  Elsevier Interactive Patient Education ©2016 [employer]

## 2016-01-08 MED ORDER — GADOBENATE DIMEGLUMINE 529 MG/ML IV SOLN
20.0000 mL | Freq: Once | INTRAVENOUS | Status: AC | PRN
  Administered 2016-01-03: 20 mL via INTRAVENOUS

## 2016-02-07 DIAGNOSIS — M545 Low back pain: Secondary | ICD-10-CM | POA: Diagnosis not present

## 2016-02-10 DIAGNOSIS — M961 Postlaminectomy syndrome, not elsewhere classified: Secondary | ICD-10-CM | POA: Diagnosis not present

## 2016-02-10 DIAGNOSIS — Z6827 Body mass index (BMI) 27.0-27.9, adult: Secondary | ICD-10-CM | POA: Diagnosis not present

## 2016-05-11 DIAGNOSIS — M961 Postlaminectomy syndrome, not elsewhere classified: Secondary | ICD-10-CM | POA: Diagnosis not present

## 2017-01-22 IMAGING — CR DG CHEST 2V
2 series · 2 of 2 positions shown · non-contrast
Comparison: Portable chest x-ray March 27, 2014

CLINICAL DATA: Preoperative exam prior to lumbar surgery ; long
history of tobacco use, no active cardiopulmonary symptoms.

EXAM:
CHEST  2 VIEW

[w chest pa]
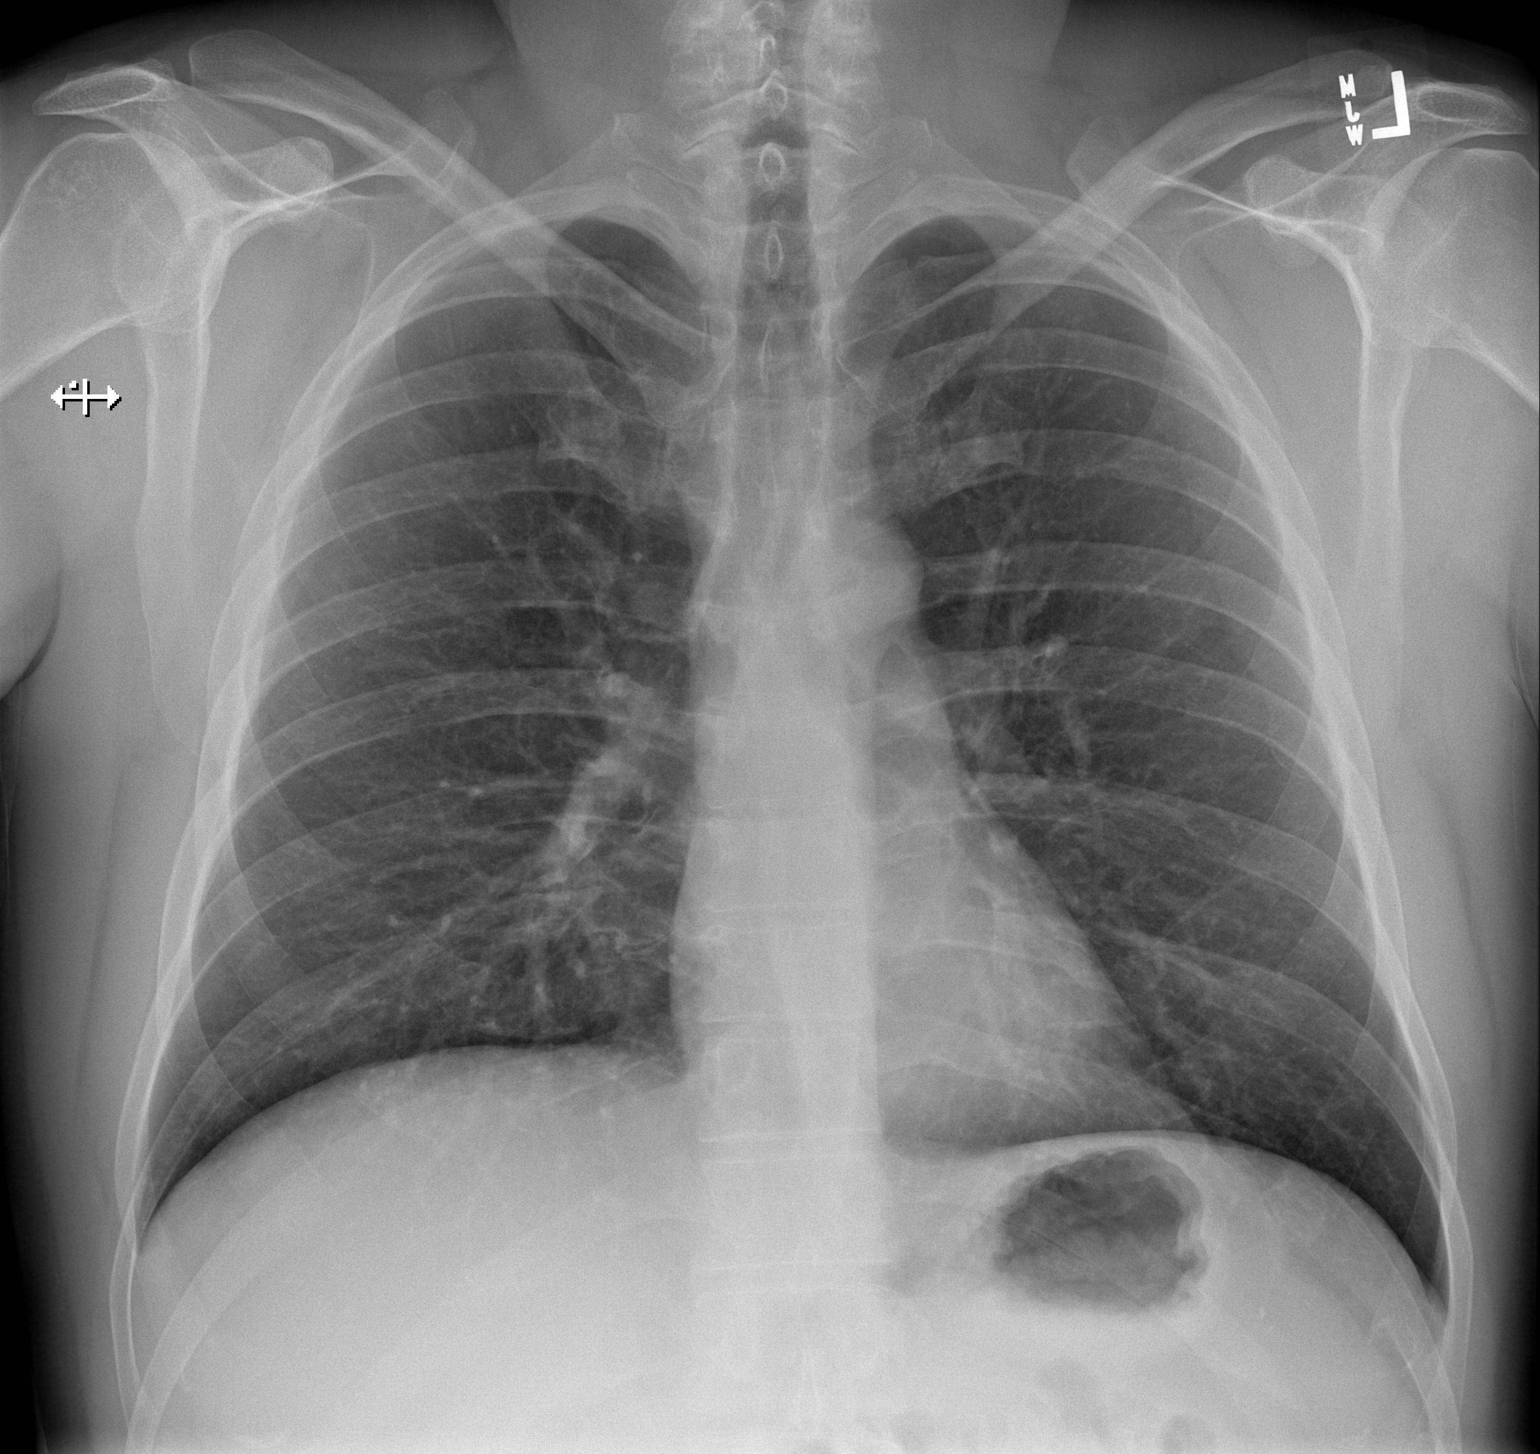

[w chest lat]
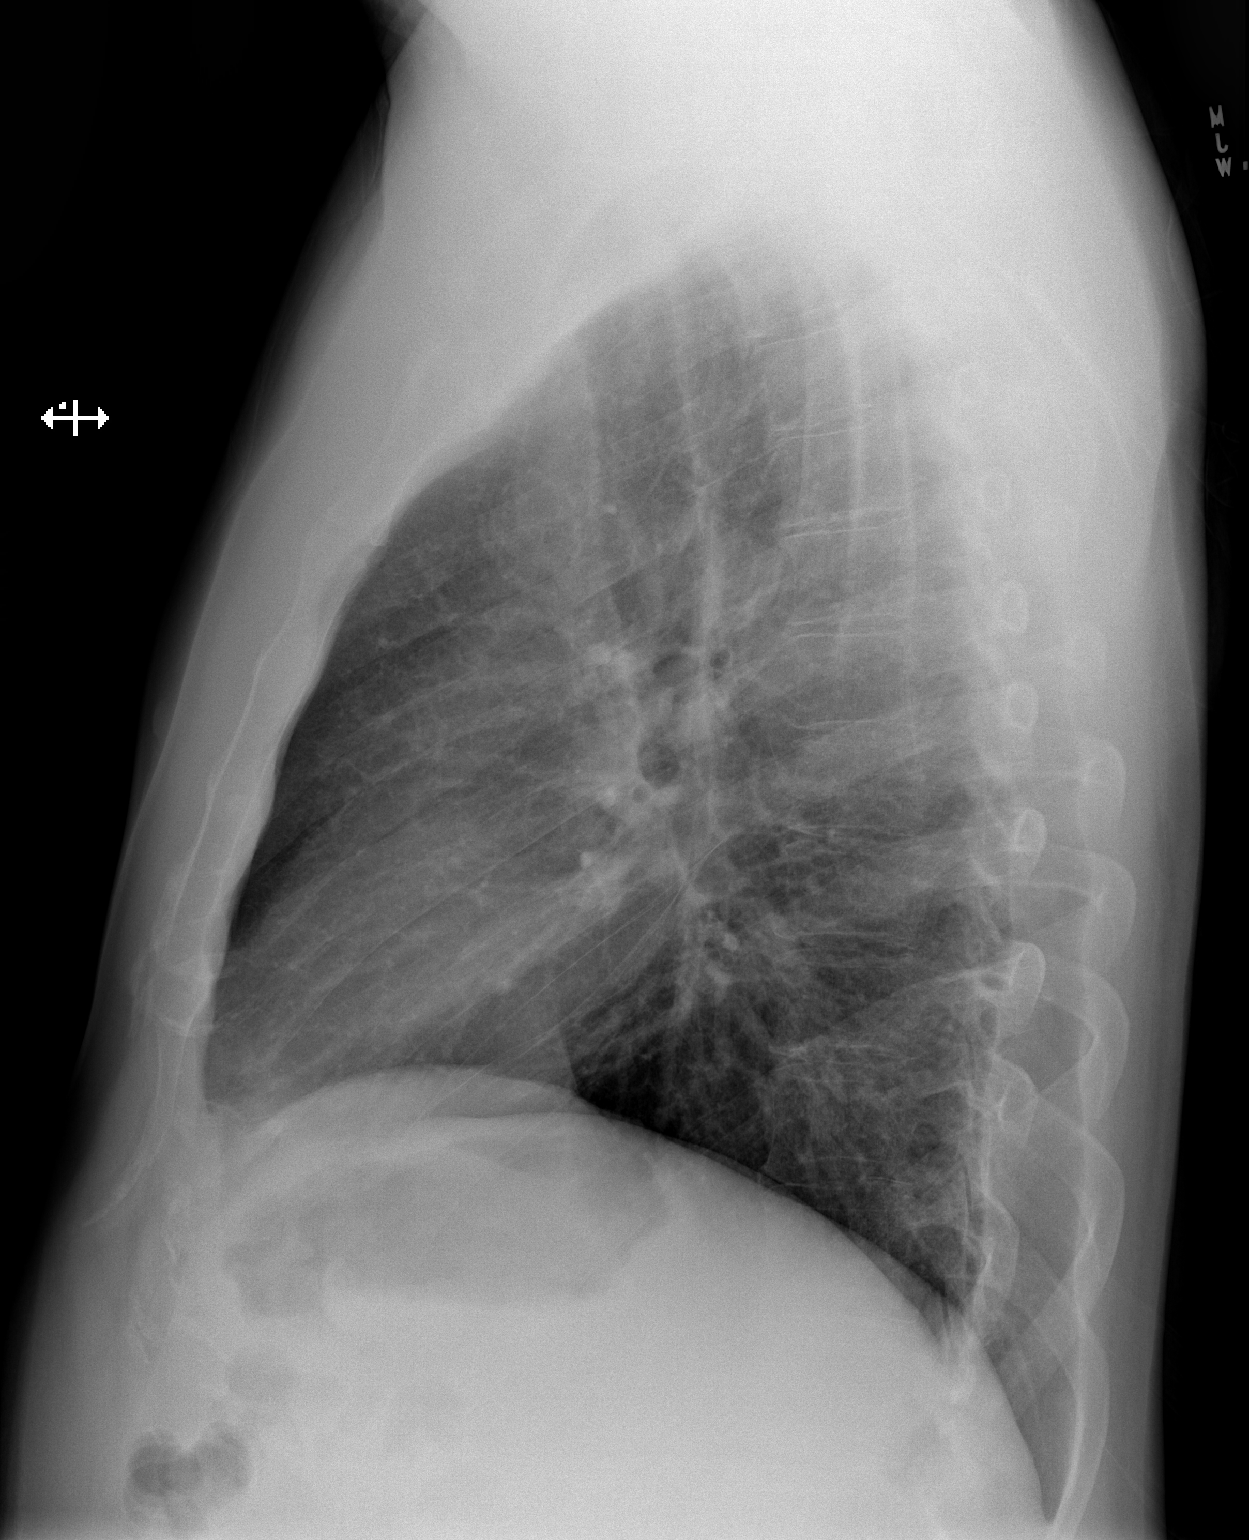

[2 of 2 positions shown; findings below may reference images not displayed]

FINDINGS: The lungs are adequately inflated and clear. The heart and pulmonary
vascularity are normal. The mediastinum is normal in width. There is
no pleural effusion. The bony thorax exhibits no acute abnormality.
IMPRESSION: There is no active cardiopulmonary disease.

## 2018-04-13 DIAGNOSIS — L03119 Cellulitis of unspecified part of limb: Secondary | ICD-10-CM | POA: Diagnosis not present

## 2018-10-22 ENCOUNTER — Emergency Department (HOSPITAL_COMMUNITY)
Admission: EM | Admit: 2018-10-22 | Discharge: 2018-10-23 | Disposition: A | Discharge status: Home / Self Care | Payer: Medicare Other | Attending: Emergency Medicine | Admitting: Emergency Medicine

## 2018-10-22 ENCOUNTER — Encounter (HOSPITAL_COMMUNITY): Payer: Self-pay

## 2018-10-22 ENCOUNTER — Emergency Department (HOSPITAL_COMMUNITY): Payer: Medicare Other

## 2018-10-22 ENCOUNTER — Other Ambulatory Visit: Payer: Self-pay

## 2018-10-22 DIAGNOSIS — M545 Low back pain, unspecified: Secondary | ICD-10-CM

## 2018-10-22 DIAGNOSIS — F17210 Nicotine dependence, cigarettes, uncomplicated: Secondary | ICD-10-CM | POA: Insufficient documentation

## 2018-10-22 MED ORDER — LIDOCAINE 5 % EX PTCH
1.0000 | MEDICATED_PATCH | CUTANEOUS | Status: DC
  Administered 2018-10-22: 1 via TRANSDERMAL
  Filled 2018-10-22: qty 1

## 2018-10-22 MED ORDER — KETOROLAC TROMETHAMINE 30 MG/ML IJ SOLN
30.0000 mg | Freq: Once | INTRAMUSCULAR | Status: AC
  Administered 2018-10-22: 30 mg via INTRAMUSCULAR
  Filled 2018-10-22: qty 1

## 2018-10-22 MED ORDER — DIAZEPAM 5 MG/ML IJ SOLN
5.0000 mg | Freq: Once | INTRAMUSCULAR | Status: AC
  Administered 2018-10-22: 5 mg via INTRAMUSCULAR

## 2018-10-22 MED ORDER — ACETAMINOPHEN 500 MG PO TABS
1000.0000 mg | ORAL_TABLET | Freq: Once | ORAL | Status: AC
  Administered 2018-10-22: 1000 mg via ORAL
  Filled 2018-10-22: qty 2

## 2018-10-22 MED ORDER — DIAZEPAM 5 MG/ML IJ SOLN
5.0000 mg | Freq: Once | INTRAMUSCULAR | Status: DC
  Filled 2018-10-22: qty 2

## 2018-10-22 NOTE — ED Triage Notes (Signed)
Pt from home with c/o 10/10 back pain since this am. Pt states when he woke up he thought he "slept on it wrong" and was hoping the pain would subside. Pt states he had a spinal fusion several years ago. Patient placed in position of comfort.

## 2018-10-23 MED ORDER — CYCLOBENZAPRINE HCL 10 MG PO TABS: 10.0000 mg | ORAL_TABLET | Freq: Three times a day (TID) | ORAL | 0 refills | Status: AC | PRN

## 2018-10-23 MED ORDER — OXYCODONE HCL 5 MG PO TABS: 5.0000 mg | ORAL_TABLET | Freq: Once | ORAL | Status: DC

## 2018-10-23 NOTE — ED Notes (Signed)
Unable to give ordered oxycodone due to allergy.  Provider gone for the night.  Spoke with current provider.  No new orders received.  Assisted patient to wheelchair and out of the department.

## 2018-10-23 NOTE — ED Provider Notes (Signed)
[facility] EM[facility]GENC[facility] Provider Note   CSN: [id number] Arrival date & time: 10/22/18  2150     History   Chief Complaint Chief Complaint  Patient presents with  . Back Pain    HPI Alvera Singhravis Gravois is a 47 y.o. male.  47 year old male with past medical history including kidney stones, lumbar laminectomy and spinal fusion who presents with low back pain.  This morning after he waking up he began having right lower back pain that goes into his buttock and around to his anterior right thigh.  Pain has progressed throughout the day and is currently 10/10 in intensity and constant.  It is worse with movements of his leg.  He denies any associated leg numbness or weakness.  No bowel or bladder incontinence, saddle anesthesia, fevers, or abdominal pain.  No recent change in physical activity or trauma.  He had spine surgery in 2017 and later had hardware removed.  He notes that he has done well since then and does not take any daily pain medications for his symptoms.  He tried Tylenol this morning as well as heat therapy with no relief.  He denies any IV drug use.  The history is provided by the patient.  Back Pain      Past Medical History:  Diagnosis Date  . Arthritis    back  . WGNFAOZH(086.5Headache(784.0)     Patient Active Problem List   Diagnosis Date Noted  . Nephrolithiasis 03/28/2014  . Intractable low back pain 03/28/2014  . S/P lumbar fusion 03/28/2014  . Leukocytosis, unspecified 03/28/2014  . ARF (acute renal failure) (HCC) 03/28/2014  . Right ureteral calculus 03/28/2014  . Epidural hematoma (HCC) 03/22/2014  . S/P lumbar spinal fusion 03/15/2014  . 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 KNEE SURGERY Left   . LAMINECTOMY N/A 03/22/2014   Procedure: irrigation and debriedment of lumbar wound;  Surgeon: Karn CassisErnesto M Botero, MD;  Location: [facility];  Service: Neurosurgery;  Laterality: N/A;  . MAXIMUM  ACCESS (MAS)POSTERIOR LUMBAR INTERBODY FUSION (PLIF) 2 LEVEL N/A 03/15/2014   Procedure: FOR MAXIMUM ACCESS (MAS) POSTERIOR LUMBAR INTERBODY FUSION (PLIF) 2 LEVEL four/five five/sacral one;  Surgeon: Tia Alertavid S Jones, MD;  Location: [facility];  Service: Neurosurgery;  Laterality: N/A;        Home Medications    Prior to Admission medications   Medication Sig Start Date End Date Taking? Authorizing Provider  cyclobenzaprine (FLEXERIL) 10 MG tablet Take 1 tablet (10 mg total) by mouth 3 (three) times daily as needed for muscle spasms. 10/23/18   Artemisia Auvil, Ambrose Finlandachel Morgan, MD  diazepam (VALIUM) 5 MG tablet Take 1-2 tablets (5-10 mg total) by mouth every 6 (six) hours as needed for muscle spasms. 12/28/15   Tia AlertJones, David S, MD  morphine (MSIR) 15 MG tablet Take 1 tablet (15 mg total) by mouth every 4 (four) hours as needed for moderate pain or severe pain. 12/28/15   Tia AlertJones, David S, MD  tiZANidine (ZANAFLEX) 4 MG tablet Take 4 mg by mouth every 6 (six) hours as needed for muscle spasms.    [provider]    Family History No family history on file.  Social History Social History   Tobacco Use  . Smoking status: Current Every Day Smoker    Packs/day: 1.00    Years: 26.00    Pack years: 26.00  . Smokeless tobacco: Never Used  Substance Use Topics  .  Alcohol use: Yes    Comment: social  . Drug use: No     Allergies   Codeine; Cortisone; Dexamethasone; Ketorolac tromethamine; Nsaids; and Sulfa antibiotics   Review of Systems Review of Systems  Musculoskeletal: Positive for back pain.   All other systems reviewed and are negative except that which was mentioned in HPI   Physical Exam Updated Vital Signs BP 131/88 (BP Location: Right Arm)   Pulse 76   Temp 98.1 F (36.7 C) (Oral)   Resp 14   Ht 6\' 1"  (1.854 m)   Wt 88.5 kg   SpO2 97%   BMI 25.73 kg/m   Physical Exam  Constitutional: He is oriented to person, place, and time. He appears well-developed and  well-nourished. No distress.  Uncomfortable  HENT:  Head: Normocephalic and atraumatic.  Moist mucous membranes  Eyes: Conjunctivae are normal.  Neck: Neck supple.  Cardiovascular: Normal rate, regular rhythm and normal heart sounds.  No murmur heard. Pulmonary/Chest: Effort normal and breath sounds normal.  Abdominal: Soft. Bowel sounds are normal. He exhibits no distension. There is no tenderness.  Musculoskeletal: He exhibits no edema.       Back:  Tenderness R lower lumbar paraspinal muscles  Neurological: He is alert and oriented to person, place, and time. He displays normal reflexes. No sensory deficit.  Fluent speech 5/5 strength BLE  Skin: Skin is warm and dry.  Psychiatric: He has a normal mood and affect. Judgment normal.  Nursing note and vitals reviewed.    ED Treatments / Results  Labs (all labs ordered are listed, but only abnormal results are displayed) Labs Reviewed - No data to display  EKG None  Radiology Dg Lumbar Spine 2-3 Views  Result Date: 10/22/2018 CLINICAL DATA:  Right low back pain EXAM: LUMBAR SPINE - 2-3 VIEW COMPARISON:  02/10/2016 FINDINGS: Postoperative changes at L4-5 and L5-S1. Normal alignment. No fracture. SI joints symmetric and unremarkable. IMPRESSION: No acute bony abnormality. Electronically Signed   By: Charlett Nose M.D.   On: 10/22/2018 23:28    Procedures Procedures (including critical care time)  Medications Ordered in ED Medications  lidocaine (LIDODERM) 5 % 1 patch (1 patch Transdermal Patch Applied 10/22/18 2341)  oxyCODONE (Oxy IR/ROXICODONE) immediate release tablet 5 mg (has no administration in time range)  acetaminophen (TYLENOL) tablet 1,000 mg (1,000 mg Oral Given 10/22/18 2338)  ketorolac (TORADOL) 30 MG/ML injection 30 mg (30 mg Intramuscular Given 10/22/18 2340)  diazepam (VALIUM) injection 5 mg (5 mg Intramuscular Given 10/22/18 2339)     Initial Impression / Assessment and Plan / ED Course  I have  reviewed the triage vital signs and the nursing notes.  Pertinent  imaging results that were available during my care of the patient were reviewed by me and considered in my medical decision making (see chart for details).    Neurologically intact. No signs or sx of cauda equina or infectious process.  Because of sudden symptoms and previous surgery, obtained x-ray to rule out acute bony process.  X-ray negative acute.  Gave the patient Valium, Toradol, Tylenol, and lidocaine patch in the ED.  Discussed treatment at home including scheduled NSAIDs and Tylenol, muscle relaxants as needed, and heat therapy as well as early range of motion.  He will follow-up with his spine specialist, Dr. Yetta Barre, in the clinic.  Extensively reviewed return precautions and he has voiced understanding.  Final Clinical Impressions(s) / ED Diagnoses   Final diagnoses:  Low back pain  Acute right-sided  low back pain without sciatica    ED Discharge Orders         Ordered    cyclobenzaprine (FLEXERIL) 10 MG tablet  3 times daily PRN     10/23/18 0006           Trevious Rampey, Ambrose Finland, MD 10/23/18 0011

## 2018-10-25 DIAGNOSIS — Z6827 Body mass index (BMI) 27.0-27.9, adult: Secondary | ICD-10-CM | POA: Diagnosis not present

## 2018-10-25 DIAGNOSIS — M5416 Radiculopathy, lumbar region: Secondary | ICD-10-CM | POA: Diagnosis not present

## 2018-10-25 DIAGNOSIS — R030 Elevated blood-pressure reading, without diagnosis of hypertension: Secondary | ICD-10-CM | POA: Diagnosis not present

## 2018-11-01 ENCOUNTER — Other Ambulatory Visit: Payer: Self-pay | Admitting: Student

## 2018-11-01 DIAGNOSIS — M5416 Radiculopathy, lumbar region: Secondary | ICD-10-CM

## 2018-11-04 DIAGNOSIS — M5126 Other intervertebral disc displacement, lumbar region: Secondary | ICD-10-CM | POA: Diagnosis not present

## 2018-11-04 DIAGNOSIS — Z981 Arthrodesis status: Secondary | ICD-10-CM | POA: Diagnosis not present

## 2018-11-04 DIAGNOSIS — M5127 Other intervertebral disc displacement, lumbosacral region: Secondary | ICD-10-CM | POA: Diagnosis not present

## 2018-11-14 DIAGNOSIS — M5416 Radiculopathy, lumbar region: Secondary | ICD-10-CM | POA: Diagnosis not present

## 2018-11-15 ENCOUNTER — Other Ambulatory Visit: Payer: Self-pay | Admitting: Student

## 2018-11-15 DIAGNOSIS — M5416 Radiculopathy, lumbar region: Secondary | ICD-10-CM

## 2018-11-16 ENCOUNTER — Other Ambulatory Visit: Payer: Medicare Other

## 2018-11-17 ENCOUNTER — Ambulatory Visit
Admission: RE | Admit: 2018-11-17 | Discharge: 2018-11-17 | Disposition: A | Discharge status: Home / Self Care | Payer: Medicare Other | Source: Ambulatory Visit | Attending: Student | Admitting: Student

## 2018-11-17 DIAGNOSIS — M5416 Radiculopathy, lumbar region: Secondary | ICD-10-CM

## 2018-11-17 DIAGNOSIS — M5126 Other intervertebral disc displacement, lumbar region: Secondary | ICD-10-CM | POA: Diagnosis not present

## 2018-11-17 MED ORDER — IOPAMIDOL (ISOVUE-M 200) INJECTION 41%
1.0000 mL | Freq: Once | INTRAMUSCULAR | Status: AC
  Administered 2018-11-17: 1 mL via EPIDURAL

## 2018-11-17 MED ORDER — METHYLPREDNISOLONE ACETATE 40 MG/ML INJ SUSP (RADIOLOG
120.0000 mg | Freq: Once | INTRAMUSCULAR | Status: AC
  Administered 2018-11-17: 120 mg via EPIDURAL

## 2018-11-17 NOTE — Discharge Instructions (Signed)

## 2018-12-12 ENCOUNTER — Other Ambulatory Visit: Payer: Self-pay | Admitting: Student

## 2018-12-12 DIAGNOSIS — M5416 Radiculopathy, lumbar region: Secondary | ICD-10-CM

## 2022-05-27 ENCOUNTER — Emergency Department (HOSPITAL_COMMUNITY)
Admission: EM | Admit: 2022-05-27 | Discharge: 2022-05-27 | Disposition: A | Discharge status: Home / Self Care | Payer: Medicare Other | Attending: Emergency Medicine | Admitting: Emergency Medicine

## 2022-05-27 ENCOUNTER — Emergency Department (HOSPITAL_COMMUNITY): Payer: Medicare Other

## 2022-05-27 ENCOUNTER — Encounter (HOSPITAL_COMMUNITY): Payer: Self-pay

## 2022-05-27 ENCOUNTER — Other Ambulatory Visit: Payer: Self-pay

## 2022-05-27 DIAGNOSIS — Z7951 Long term (current) use of inhaled steroids: Secondary | ICD-10-CM | POA: Insufficient documentation

## 2022-05-27 DIAGNOSIS — J441 Chronic obstructive pulmonary disease with (acute) exacerbation: Secondary | ICD-10-CM | POA: Diagnosis not present

## 2022-05-27 DIAGNOSIS — R0789 Other chest pain: Secondary | ICD-10-CM | POA: Diagnosis not present

## 2022-05-27 DIAGNOSIS — D72829 Elevated white blood cell count, unspecified: Secondary | ICD-10-CM | POA: Diagnosis not present

## 2022-05-27 DIAGNOSIS — Z87891 Personal history of nicotine dependence: Secondary | ICD-10-CM | POA: Diagnosis not present

## 2022-05-27 DIAGNOSIS — R079 Chest pain, unspecified: Secondary | ICD-10-CM | POA: Diagnosis not present

## 2022-05-27 LAB — TROPONIN I (HIGH SENSITIVITY)
Troponin I (High Sensitivity): 6 ng/L (ref <18)
Troponin I (High Sensitivity): 5 ng/L (ref <18)

## 2022-05-27 LAB — BASIC METABOLIC PANEL
Anion gap: 12 (ref 5–15)
BUN: 16 mg/dL (ref 6–20)
CO2: 19 mmol/L — ABNORMAL LOW (ref 22–32)
Calcium: 9.4 mg/dL (ref 8.9–10.3)
Chloride: 107 mmol/L (ref 98–111)
Creatinine, Ser: 1.08 mg/dL (ref 0.61–1.24)
GFR, Estimated: >60 mL/min (ref >60)
Glucose, Bld: 107 mg/dL — ABNORMAL HIGH (ref 70–99)
Potassium: 3.9 mmol/L (ref 3.5–5.1)
Sodium: 138 mmol/L (ref 135–145)

## 2022-05-27 LAB — CBC
HCT: 50.2 % (ref 39.0–52.0)
Hemoglobin: 17.2 g/dL — ABNORMAL HIGH (ref 13.0–17.0)
MCH: 31.9 pg (ref 26.0–34.0)
MCHC: 34.3 g/dL (ref 30.0–36.0)
MCV: 93.1 fL (ref 80.0–100.0)
Platelets: 219 10*3/uL (ref 150–400)
RBC: 5.39 MIL/uL (ref 4.22–5.81)
RDW: 13.5 % (ref 11.5–15.5)
WBC: 13.2 10*3/uL — ABNORMAL HIGH (ref 4.0–10.5)
nRBC: 0 % (ref 0.0–0.2)

## 2022-05-27 LAB — D-DIMER, QUANTITATIVE: D-Dimer, Quant: 0.34 ug/mL-FEU (ref 0.00–0.50)

## 2022-05-27 MED ORDER — IPRATROPIUM-ALBUTEROL 0.5-2.5 (3) MG/3ML IN SOLN
3.0000 mL | Freq: Once | RESPIRATORY_TRACT | Status: AC
  Administered 2022-05-27: 3 mL via RESPIRATORY_TRACT
  Filled 2022-05-27: qty 3

## 2022-05-27 MED ORDER — HYDROCODONE-ACETAMINOPHEN 5-325 MG PO TABS
1.0000 | ORAL_TABLET | Freq: Once | ORAL | Status: AC
  Administered 2022-05-27: 1 via ORAL
  Filled 2022-05-27: qty 1

## 2022-05-27 MED ORDER — PREDNISONE 20 MG PO TABS: ORAL_TABLET | ORAL | 0 refills | Status: AC

## 2022-05-27 MED ORDER — ALBUTEROL SULFATE HFA 108 (90 BASE) MCG/ACT IN AERS: 1.0000 | INHALATION_SPRAY | Freq: Four times a day (QID) | RESPIRATORY_TRACT | 0 refills | Status: AC | PRN

## 2022-05-27 NOTE — ED Provider Notes (Incomplete)
[facility] EMERGENCY DEPARTMENT Provider Note   CSN: [id number] Arrival date & time: 05/27/22  1409     History {Add pertinent medical, surgical, social history, OB history to HPI:1} Chief Complaint  Patient presents with   Chest Pain   Shortness of Breath    Keith Dawson is a 51 y.o. male.  Patient complains of tightness in his chest.  Patient has a history of cigarette use   Chest Pain Associated symptoms: shortness of breath   Shortness of Breath Associated symptoms: chest pain        Home Medications Prior to Admission medications   Medication Sig Start Date End Date Taking? Authorizing Provider  albuterol (VENTOLIN HFA) 108 (90 Base) MCG/ACT inhaler Inhale 1-2 puffs into the lungs every 6 (six) hours as needed for wheezing or shortness of breath. 05/27/22  Yes Bethann Berkshire, MD  predniSONE (DELTASONE) 20 MG tablet 2 tabs po daily x 3 days 05/27/22  Yes Bethann Berkshire, MD  cyclobenzaprine (FLEXERIL) 10 MG tablet Take 1 tablet (10 mg total) by mouth 3 (three) times daily as needed for muscle spasms. 10/23/18   Little, Ambrose Finland, MD  diazepam (VALIUM) 5 MG tablet Take 1-2 tablets (5-10 mg total) by mouth every 6 (six) hours as needed for muscle spasms. 12/28/15   Tia Alert, MD  morphine (MSIR) 15 MG tablet Take 1 tablet (15 mg total) by mouth every 4 (four) hours as needed for moderate pain or severe pain. 12/28/15   Tia Alert, MD  tiZANidine (ZANAFLEX) 4 MG tablet Take 4 mg by mouth every 6 (six) hours as needed for muscle spasms.    [provider]      Allergies    Codeine, Cortisone, Dexamethasone, Ketorolac tromethamine, Nsaids, Oxycodone, and Sulfa antibiotics    Review of Systems   Review of Systems  Respiratory:  Positive for shortness of breath.   Cardiovascular:  Positive for chest pain.    Physical Exam Updated Vital Signs BP (!) 137/91   Pulse 64   Temp 97.8 F (36.6 C) (Oral)   Resp 16   Ht 6\' 1"  (1.854 m)    Wt 81.6 kg   SpO2 95%   BMI 23.75 kg/m  Physical Exam  ED Results / Procedures / Treatments   Labs (all labs ordered are listed, but only abnormal results are displayed) Labs Reviewed  BASIC METABOLIC PANEL - Abnormal; Notable for the following components:      Result Value   CO2 19 (*)    Glucose, Bld 107 (*)    All other components within normal limits  CBC - Abnormal; Notable for the following components:   WBC 13.2 (*)    Hemoglobin 17.2 (*)    All other components within normal limits  D-DIMER, QUANTITATIVE  TROPONIN I (HIGH SENSITIVITY)  TROPONIN I (HIGH SENSITIVITY)    EKG EKG Interpretation  Date/Time:  Wednesday May 27 2022 15:06:49 EDT Ventricular Rate:  76 PR Interval:  154 QRS Duration: 84 QT Interval:  364 QTC Calculation: 409 R Axis:   80 Text Interpretation: Normal sinus rhythm Normal ECG When compared with ECG of 18-Dec-2015 09:51, PREVIOUS ECG IS PRESENT Confirmed by 20-Dec-2015 [id number]) on 05/27/2022 10:17:38 PM  Radiology DG Chest 2 View  Result Date: 05/27/2022 CLINICAL DATA:  Chest pain EXAM: CHEST - 2 VIEW COMPARISON:  12/18/2015 FINDINGS: The heart size and mediastinal contours are within normal limits. Both lungs are clear. The visualized skeletal structures are unremarkable. IMPRESSION:  No active cardiopulmonary disease. Electronically Signed   By: Jasmine Pang M.D.   On: 05/27/2022 15:41    Procedures Procedures  {Document cardiac monitor, telemetry assessment procedure when appropriate:1}  Medications Ordered in ED Medications  HYDROcodone-acetaminophen (NORCO/VICODIN) 5-325 MG per tablet 1 tablet (1 tablet Oral Given 05/27/22 2028)  ipratropium-albuterol (DUONEB) 0.5-2.5 (3) MG/3ML nebulizer solution 3 mL (3 mLs Nebulization Given 05/27/22 2148)    ED Course/ Medical Decision Making/ A&P                           Medical Decision Making Amount and/or Complexity of Data Reviewed Labs: ordered. Radiology:  ordered.  Risk Prescription drug management.  Patient with tightness in his chest.  Troponin is normal.  Doubt cardiac cause.  Patient improved with neb treatment and he will be sent home with albuterol and prednisone.  Most likely mild COPD  {Document critical care time when appropriate:1} {Document review of labs and clinical decision tools ie heart score, Chads2Vasc2 etc:1}  {Document your independent review of radiology images, and any outside records:1} {Document your discussion with family members, caretakers, and with consultants:1} {Document social determinants of health affecting pt's care:1} {Document your decision making why or why not admission, treatments were needed:1} Final Clinical Impression(s) / ED Diagnoses Final diagnoses:  Atypical chest pain  COPD exacerbation (HCC)    Rx / DC Orders ED Discharge Orders          Ordered    albuterol (VENTOLIN HFA) 108 (90 Base) MCG/ACT inhaler  Every 6 hours PRN        05/27/22 2223    predniSONE (DELTASONE) 20 MG tablet        05/27/22 2223

## 2022-05-27 NOTE — ED Provider Triage Note (Cosign Needed)
Emergency Medicine Provider Triage Evaluation Note  Bartlomiej Jenkinson , a 51 y.o. male  was evaluated in triage.  Pt complains of CP since this AM when he woke up.   States he feels LH and unwell. CP is left sided. Non radiating.  No abd pain NV.   Review of Systems  Positive: CP, Sob, LH Negative: Fever   Physical Exam  BP 128/87 (BP Location: Right Arm)   Pulse 70   Temp 97.8 F (36.6 C) (Oral)   Resp (!) 22   Ht 6\' 1"  (1.854 m)   Wt 81.6 kg   SpO2 100%   BMI 23.75 kg/m  Gen:   Awake, no distress   Resp:  Normal effort  MSK:   Moves extremities without difficulty  Other:  Legs without edema, lungs clear  Medical Decision Making  Medically screening exam initiated at 3:52 PM.  Appropriate orders placed.  Kaulana Brindle was informed that the remainder of the evaluation will be completed by another provider, this initial triage assessment does not replace that evaluation, and the importance of remaining in the ED until their evaluation is complete.  Labs, xray   Alvera Singh [place], DOLE 05/27/22 1553

## 2022-05-27 NOTE — ED Triage Notes (Signed)
Pt reports left sided Chest pain and Shob associated with dizziness, onset this morning after he woke up. Denies n/v/d/fevers.

## 2022-05-27 NOTE — ED Notes (Signed)
Patient verbalizes understanding of discharge instructions. Opportunity for questioning and answers were provided. Armband removed by staff, pt discharged from ED.  

## 2022-05-27 NOTE — Discharge Instructions (Signed)
Follow-up with your family doctor next week for recheck. 

## 2022-06-03 DIAGNOSIS — R0602 Shortness of breath: Secondary | ICD-10-CM | POA: Diagnosis not present

## 2022-12-23 DIAGNOSIS — Z0000 Encounter for general adult medical examination without abnormal findings: Secondary | ICD-10-CM | POA: Diagnosis not present

## 2022-12-23 DIAGNOSIS — Z1211 Encounter for screening for malignant neoplasm of colon: Secondary | ICD-10-CM | POA: Diagnosis not present

## 2022-12-23 DIAGNOSIS — Z125 Encounter for screening for malignant neoplasm of prostate: Secondary | ICD-10-CM | POA: Diagnosis not present

## 2022-12-23 DIAGNOSIS — E782 Mixed hyperlipidemia: Secondary | ICD-10-CM | POA: Diagnosis not present

## 2022-12-23 DIAGNOSIS — Z6828 Body mass index (BMI) 28.0-28.9, adult: Secondary | ICD-10-CM | POA: Diagnosis not present

## 2023-02-10 DIAGNOSIS — U071 COVID-19: Secondary | ICD-10-CM | POA: Diagnosis not present

## 2023-12-29 DIAGNOSIS — Z7189 Other specified counseling: Secondary | ICD-10-CM | POA: Diagnosis not present

## 2023-12-29 DIAGNOSIS — E782 Mixed hyperlipidemia: Secondary | ICD-10-CM | POA: Diagnosis not present

## 2023-12-29 DIAGNOSIS — G8929 Other chronic pain: Secondary | ICD-10-CM | POA: Diagnosis not present

## 2023-12-29 DIAGNOSIS — Z23 Encounter for immunization: Secondary | ICD-10-CM | POA: Diagnosis not present

## 2023-12-29 DIAGNOSIS — Z125 Encounter for screening for malignant neoplasm of prostate: Secondary | ICD-10-CM | POA: Diagnosis not present

## 2023-12-29 DIAGNOSIS — Z1331 Encounter for screening for depression: Secondary | ICD-10-CM | POA: Diagnosis not present

## 2023-12-29 DIAGNOSIS — F17210 Nicotine dependence, cigarettes, uncomplicated: Secondary | ICD-10-CM | POA: Diagnosis not present

## 2023-12-29 DIAGNOSIS — M545 Low back pain, unspecified: Secondary | ICD-10-CM | POA: Diagnosis not present

## 2023-12-29 DIAGNOSIS — Z0000 Encounter for general adult medical examination without abnormal findings: Secondary | ICD-10-CM | POA: Diagnosis not present

## 2023-12-29 DIAGNOSIS — Z131 Encounter for screening for diabetes mellitus: Secondary | ICD-10-CM | POA: Diagnosis not present

## 2023-12-29 DIAGNOSIS — Z1159 Encounter for screening for other viral diseases: Secondary | ICD-10-CM | POA: Diagnosis not present
